# Patient Record
Sex: Female | Born: 1960 | Race: White | Hispanic: No | Smoking: Current every day smoker
Health system: Southern US, Community
[De-identification: ages and names within clinical notes are randomized; demographics above are authoritative.]

## PROBLEM LIST (undated history)

## (undated) DIAGNOSIS — G4733 Obstructive sleep apnea (adult) (pediatric): Secondary | ICD-10-CM

## (undated) DIAGNOSIS — K219 Gastro-esophageal reflux disease without esophagitis: Secondary | ICD-10-CM

## (undated) HISTORY — PX: APPENDECTOMY: SHX54

## (undated) HISTORY — DX: Gastro-esophageal reflux disease without esophagitis: K21.9

## (undated) HISTORY — PX: INCONTINENCE SURGERY: SHX676

## (undated) HISTORY — PX: ABDOMINAL HYSTERECTOMY: SHX81

## (undated) HISTORY — PX: TONSILLECTOMY: SUR1361

---

## 2000-10-15 ENCOUNTER — Encounter: Payer: Self-pay | Admitting: *Deleted

## 2000-10-15 ENCOUNTER — Ambulatory Visit (HOSPITAL_COMMUNITY): Admission: RE | Admit: 2000-10-15 | Discharge: 2000-10-15 | Payer: Self-pay | Admitting: *Deleted

## 2002-02-07 ENCOUNTER — Encounter: Payer: Self-pay | Admitting: *Deleted

## 2002-02-07 ENCOUNTER — Emergency Department (HOSPITAL_COMMUNITY): Admission: EM | Admit: 2002-02-07 | Discharge: 2002-02-07 | Payer: Self-pay | Admitting: *Deleted

## 2002-03-09 ENCOUNTER — Encounter: Payer: Self-pay | Admitting: Emergency Medicine

## 2002-03-09 ENCOUNTER — Emergency Department (HOSPITAL_COMMUNITY): Admission: EM | Admit: 2002-03-09 | Discharge: 2002-03-09 | Payer: Self-pay | Admitting: Emergency Medicine

## 2008-04-27 ENCOUNTER — Encounter (INDEPENDENT_AMBULATORY_CARE_PROVIDER_SITE_OTHER): Payer: Self-pay | Admitting: General Surgery

## 2008-04-28 ENCOUNTER — Ambulatory Visit: Payer: Self-pay | Admitting: Gastroenterology

## 2008-04-28 ENCOUNTER — Inpatient Hospital Stay (HOSPITAL_COMMUNITY): Admission: AD | Admit: 2008-04-28 | Discharge: 2008-04-30 | Payer: Self-pay | Admitting: Emergency Medicine

## 2008-05-04 ENCOUNTER — Encounter (INDEPENDENT_AMBULATORY_CARE_PROVIDER_SITE_OTHER): Payer: Self-pay | Admitting: *Deleted

## 2008-06-14 ENCOUNTER — Encounter: Payer: Self-pay | Admitting: Gastroenterology

## 2009-07-22 ENCOUNTER — Emergency Department (HOSPITAL_COMMUNITY): Admission: EM | Admit: 2009-07-22 | Discharge: 2009-07-22 | Payer: Self-pay | Admitting: Emergency Medicine

## 2010-02-13 NOTE — Consult Note (Signed)
Summary: Consultation Report  Consultation Report   Imported By: Diana Eves 06/14/2008 10:34:36  _____________________________________________________________________  External Attachment:    Type:   Image     Comment:   External Document

## 2010-02-13 NOTE — Letter (Signed)
Summary: Scheduled Appointment  [facility]  [address]   [place], [place] [address]   Phone: [phone]  Fax: [phone]    May 04, 2008   Dear: [name]            DOB: 01-20-1960    I have been instructed to schedule you an appointment in our office.  Your appointment is as follows:   Date:  06/23/2008   Time:  9:00     Please be here 15 minutes early.   Provider:  Dr. Cira Servant    Please contact the office if you need to reschedule this appointment for a more convenient time.   Thank you,    Elinor Parkinson       [facility] Ph: [phone]   Fax: [phone]

## 2010-04-25 LAB — DIFFERENTIAL
Basophils Absolute: 0 10*3/uL (ref 0.0–0.1)
Basophils Absolute: 0.1 10*3/uL (ref 0.0–0.1)
Basophils Relative: 0 % (ref 0–1)
Basophils Relative: 0 % (ref 0–1)
Eosinophils Absolute: 0 10*3/uL (ref 0.0–0.7)
Eosinophils Absolute: 0.1 10*3/uL (ref 0.0–0.7)
Eosinophils Relative: 0 % (ref 0–5)
Eosinophils Relative: 1 % (ref 0–5)
Lymphocytes Relative: 11 % — ABNORMAL LOW (ref 12–46)
Lymphocytes Relative: 4 % — ABNORMAL LOW (ref 12–46)
Lymphs Abs: 0.8 10*3/uL (ref 0.7–4.0)
Lymphs Abs: 1.6 10*3/uL (ref 0.7–4.0)
Monocytes Absolute: 0.7 10*3/uL (ref 0.1–1.0)
Monocytes Absolute: 0.7 10*3/uL (ref 0.1–1.0)
Monocytes Relative: 4 % (ref 3–12)
Monocytes Relative: 5 % (ref 3–12)
Neutro Abs: 12 10*3/uL — ABNORMAL HIGH (ref 1.7–7.7)
Neutro Abs: 16.5 10*3/uL — ABNORMAL HIGH (ref 1.7–7.7)
Neutrophils Relative %: 83 % — ABNORMAL HIGH (ref 43–77)
Neutrophils Relative %: 91 % — ABNORMAL HIGH (ref 43–77)

## 2010-04-25 LAB — URINALYSIS, ROUTINE W REFLEX MICROSCOPIC
Bilirubin Urine: NEGATIVE
Glucose, UA: NEGATIVE mg/dL
Hgb urine dipstick: NEGATIVE
Ketones, ur: NEGATIVE mg/dL
Leukocytes, UA: NEGATIVE
Nitrite: POSITIVE — AB
Protein, ur: NEGATIVE mg/dL
Specific Gravity, Urine: <1.005 — ABNORMAL LOW (ref 1.005–1.030)
Urobilinogen, UA: 0.2 mg/dL (ref 0.0–1.0)
pH: 6 (ref 5.0–8.0)

## 2010-04-25 LAB — CBC
HCT: 38.2 % (ref 36.0–46.0)
HCT: 41.9 % (ref 36.0–46.0)
Hemoglobin: 13.2 g/dL (ref 12.0–15.0)
Hemoglobin: 14.8 g/dL (ref 12.0–15.0)
MCHC: 34.6 g/dL (ref 30.0–36.0)
MCHC: 35.4 g/dL (ref 30.0–36.0)
MCV: 97.1 fL (ref 78.0–100.0)
MCV: 98.8 fL (ref 78.0–100.0)
Platelets: 310 10*3/uL (ref 150–400)
Platelets: 317 10*3/uL (ref 150–400)
RBC: 3.87 MIL/uL (ref 3.87–5.11)
RBC: 4.32 MIL/uL (ref 3.87–5.11)
RDW: 14.3 % (ref 11.5–15.5)
RDW: 14.8 % (ref 11.5–15.5)
WBC: 14.4 10*3/uL — ABNORMAL HIGH (ref 4.0–10.5)
WBC: 18 10*3/uL — ABNORMAL HIGH (ref 4.0–10.5)

## 2010-04-25 LAB — HEPATIC FUNCTION PANEL
ALT: 28 U/L (ref 0–35)
AST: 29 U/L (ref 0–37)
Albumin: 3.3 g/dL — ABNORMAL LOW (ref 3.5–5.2)
Alkaline Phosphatase: 83 U/L (ref 39–117)
Bilirubin, Direct: 0.1 mg/dL (ref 0.0–0.3)
Indirect Bilirubin: 0.5 mg/dL (ref 0.3–0.9)
Total Bilirubin: 0.6 mg/dL (ref 0.3–1.2)
Total Protein: 6.4 g/dL (ref 6.0–8.3)

## 2010-04-25 LAB — URINE MICROSCOPIC-ADD ON

## 2010-04-25 LAB — BASIC METABOLIC PANEL
BUN: 7 mg/dL (ref 6–23)
CO2: 25 mEq/L (ref 19–32)
Calcium: 8.3 mg/dL — ABNORMAL LOW (ref 8.4–10.5)
Chloride: 107 mEq/L (ref 96–112)
Creatinine, Ser: 0.81 mg/dL (ref 0.4–1.2)
GFR calc Af Amer: >60 mL/min (ref >60)
GFR calc non Af Amer: >60 mL/min (ref >60)
Glucose, Bld: 118 mg/dL — ABNORMAL HIGH (ref 70–99)
Potassium: 3.8 mEq/L (ref 3.5–5.1)
Sodium: 136 mEq/L (ref 135–145)

## 2010-04-25 LAB — COMPREHENSIVE METABOLIC PANEL
ALT: 38 U/L — ABNORMAL HIGH (ref 0–35)
AST: 41 U/L — ABNORMAL HIGH (ref 0–37)
Albumin: 3.9 g/dL (ref 3.5–5.2)
Alkaline Phosphatase: 104 U/L (ref 39–117)
BUN: 12 mg/dL (ref 6–23)
CO2: 20 mEq/L (ref 19–32)
Calcium: 9.2 mg/dL (ref 8.4–10.5)
Chloride: 103 mEq/L (ref 96–112)
Creatinine, Ser: 0.71 mg/dL (ref 0.4–1.2)
GFR calc Af Amer: >60 mL/min (ref >60)
GFR calc non Af Amer: >60 mL/min (ref >60)
Glucose, Bld: 139 mg/dL — ABNORMAL HIGH (ref 70–99)
Potassium: 3.7 mEq/L (ref 3.5–5.1)
Sodium: 135 mEq/L (ref 135–145)
Total Bilirubin: 0.5 mg/dL (ref 0.3–1.2)
Total Protein: 7.4 g/dL (ref 6.0–8.3)

## 2010-04-25 LAB — PROTIME-INR
INR: 1.1 (ref 0.00–1.49)
Prothrombin Time: 14 seconds (ref 11.6–15.2)

## 2010-04-25 LAB — HEPATITIS C ANTIBODY: HCV Ab: REACTIVE — AB

## 2010-04-25 LAB — LIPASE, BLOOD: Lipase: 16 U/L (ref 11–59)

## 2010-05-29 NOTE — Op Note (Signed)
NAMEJOSSELYN, [name]                ACCOUNT NO.:  [id number]   MEDICAL RECORD NO.:  [id number]          PATIENT TYPE:  INP   LOCATION:  A340                          FACILITY:  [facility]   PHYSICIAN:  Barbaraann Barthel, M.D. DATE OF BIRTH:  November 02, 1960   DATE OF PROCEDURE:  04/28/2008  DATE OF DISCHARGE:                               OPERATIVE REPORT   SURGEON:  Barbaraann Barthel, MD   PREOPERATIVE DIAGNOSIS:  Acute appendicitis.   POSTOPERATIVE DIAGNOSIS:  Acute appendicitis. unknown lesion at base of  tongue   PROCEDURE:  Open appendectomy.   NOTE:  This is a 50 year old white female, who came to the emergency  room with about a 12- to 14-hour history of right lower quadrant pain,  nausea and vomiting, had a CT scan that was positive for appendicitis.  We scheduled her for surgery after discussing in detail with  complications not limited to, but including bleeding, infection, and  appendiceal stump leak.  Informed consent was obtained.   GROSS OPERATIVE FINDINGS:  Those consistent with acute supportive non-  perforated appendicitis.  There was considerable scarring from her  previous total abdominal hysterectomy; otherwise, terminal ileum  appeared to be normal and there were no other findings except the  adhesions in the right lower quadrant.   SPECIMEN:  Appendix.   WOUND CLASSIFICATION:  Infected.   TECHNIQUE:  The patient was placed in a supine position and after the  adequate administration of general anesthesia via endotracheal  intubation, her entire abdomen was prepped with Betadine solution and  draped in usual manner.  Prior to this, Foley catheter was aseptically  inserted.  A right lower quadrant transverse incision was carried out  through the skin and subcutaneous tissue down to the rectus muscle,  which was retracted medially.  The posterior sheath and peritoneum were  grasped and opened.  The abdomen was explored with the above findings.  The patient had a very  thickened appendix.  This was dissected free from  the fatty adhesions.  The mesoappendix was ligated with 2-0 silk and  amputated with TA-30 stapler device.  We then changed gloves, irrigated  the right lower quadrant and I elected to leave a drain in the area of  the cecum due to the number of adhesions in this area.  We copiously  irrigated with normal saline solution and I closed the peritoneum with a  running 0 Polysorb suture and the fascia with interrupted figure-of-  eight Polysorb sutures.  I irrigated the subcu and closed with stapling  device.  Drain was sutured and placed with 4-0 nylon.  Prior to closure,  all sponge, needle, and instrument counts were found to be correct.  Estimated blood loss was minimal.  The patient received 900 mL of  crystalloids intraoperatively.  There were no complications.   NOTE: At intubation she was noted to have a lesion at the base of her  tongue.  We will followup with an ENT consult post operatively.      Barbaraann Barthel, M.D.  Electronically Signed     WB/MEDQ  D:  04/28/2008  T:  04/28/2008  Job:  [id number]   cc:   Dr. Adriana Simas

## 2010-05-29 NOTE — Consult Note (Signed)
[name], [name]                ACCOUNT NO.:  [id number]   MEDICAL RECORD NO.:  [id number]          PATIENT TYPE:  INP   LOCATION:  A340                          FACILITY:  [facility]   PHYSICIAN:  Kassie Mends, M.D.      DATE OF BIRTH:  05-12-1960   DATE OF CONSULTATION:  04/28/2008  DATE OF DISCHARGE:                                 CONSULTATION   REASON FOR CONSULTATION:  History of hepatitis C.   REQUESTING PHYSICIAN:  Barbaraann Barthel, M.D.   HISTORY OF PRESENT ILLNESS:  The patient is a 50 year old Caucasian  female who was admitted to the hospital with acute appendicitis.  She  underwent appendectomy earlier today.  She gives a history of hepatitis  C diagnosed 5 years ago.  She was followed by Dr. Allena Katz in [place],  [place], at the time.  She states that she was offered antiviral  treatment, but she was unable to afford the therapy.  She never followed  up with Dr. Allena Katz.  She said she had a liver biopsy at the time, was  told she had fibrosis but cirrhosis.  CT of the abdomen and pelvis done  yesterday showed diffuse fatty liver, no evidence of cirrhosis.  She  also had acute appendicitis as mentioned above.  The patient states on a  chronic basis she really is asymptomatic.  She does have some  intermittent constipation for which she takes lactulose.  Denies any  melena or rectal bleeding, abdominal pain, nausea, vomiting, or  heartburn on a regular basis.  Her current symptoms began yesterday  morning and progressed throughout the day prior to her evaluation here  at [facility].  She is actually referred here by her primary  care physician yesterday.   The patient states that she believes her risk factor for hepatitis C was  her IV drug use back in her 76s.  She denies any ongoing drug use.  She  rarely consumes alcohol, only on occasion.  She had a glass of wine for  her birthday about four weeks ago.   MEDICATIONS AT HOME:  Premarin and lactulose.   ALLERGIES:  DOXYCYCLINE causes hives.   PAST MEDICAL HISTORY:  1. History of hepatitis C as outlined above.  2. IBS.  3. Tonsillectomy.  4. Total abdominal hysterectomy with bilateral salpingo-oophorectomy      for endometriosis in 2004.  5. Appendectomy yesterday.   FAMILY HISTORY:  According to EMR from [facility],  father has history of hepatitis C and pancreatitis.  Grandmother had  liver cancer.  Sister has lupus.  The patient tells me that there is a  family member in the home with hepatitis C, but she would not elaborate.  She did not confirm that it was her father.   SOCIAL HISTORY:  Married.  One child.  Smokes a pack of cigarettes a  day.  Rarely consumes alcohol.  History of prior IV drug use in her 52s.   REVIEW OF SYSTEMS:  See HPI for GI.  CONSTITUTIONAL:  No weight loss.  CARDIOPULMONARY:  No chest pain or  shortness of breath, palpitations or  cough.  GENITOURINARY:  No dysuria or hematuria.   PHYSICAL EXAMINATION:  Temperature 99, pulse 84, respirations 16, blood  pressure 113/71, weight 89.6 kg.  GENERAL:  Pleasant, well-developed, well-nourished, Caucasian female in  no acute distress.  SKIN:  Warm, dry, no jaundice.  HEENT:  Sclerae nonicteric.  Oropharyngeal mucosa moist and pink.  CHEST:  Lungs clear to auscultation.  CARDIAC EXAM:  Reveals regular rate and rhythm.  No murmurs, rubs, or  gallops.  ABDOMEN:  Positive bowel sounds.  Abdomen is obese, soft.  Incisions are  dressed.  She has a JP with serosanguineous fluid.  Did not appreciate  organomegaly or masses but exam limited somewhat due to patient's  discomfort.  No abdominal hernias or bruits.  LOWER EXTREMITIES:  No edema.   LABORATORY DATA:  White count 18,000 on admission; down to 14,400.  Hemoglobin is 13.2, platelets 310,000.  Albumin 3.3, total bilirubin  0.6, alkaline phosphatase 83, AST 41 on admission - down to 29, ALT 38  on admission - down to 28.  Sodium 136, potassium  3.8, BUN 7, creatinine  0.81, glucose 118.  Lipase 16.  CT of the abdomen and pelvis was  outlined above.   IMPRESSION:  The patient is a very pleasant 50 year old Caucasian female  who was admitted with acute appendicitis who is status post appendectomy  earlier today.  She gives a history of chronic hepatitis C diagnosed in  2005 with fibrosis on liver biopsy.  She has never been treated due to  financial difficulties according to the patient.  She appears to be an  adequate candidate for antiviral therapy.  No history of major  depression.  Very limited alcohol use.  She believes that she could  eliminate use completely.  We could likely find her some assistance at  the hepatitis C clinic in [place] for treatment.   She was noted to have a lesion on the base of her tongue at time of  intubation per Dr. Barbaraann Barthel.  This needs to be evaluated prior  to any consideration of antiviral therapy, however.   PLAN:  1. Will check a hepatitis C virus antibody.  2. Check INR.  3. She will follow up with Dr. Cira Servant in 2 months regarding her      hepatitis C and to facilitate referral to the hepatitis clinic as      necessary.  Would also advise colonoscopy at age 66 if she has not      had one previously.  4. Alcohol cessation.  5. Moderate weight loss, no more than 1 pound a week for a goal of 25-      pound weight loss.   I would like to thank Dr. Barbaraann Barthel for allowing [place] to take part  in the care of this patient.      Tana Coast, P.A.      Kassie Mends, M.D.  Electronically Signed    LL/MEDQ  D:  04/28/2008  T:  04/28/2008  Job:  [id number]   cc:   [facility]

## 2010-05-29 NOTE — Discharge Summary (Signed)
[name], [name]                ACCOUNT NO.:  [id number]   MEDICAL RECORD NO.:  [id number]          PATIENT TYPE:  INP   LOCATION:  A340                          FACILITY:  [facility]   PHYSICIAN:  Barbaraann Barthel, M.D. DATE OF BIRTH:  07/29/1960   DATE OF ADMISSION:  04/27/2008  DATE OF DISCHARGE:  04/17/2010LH                               DISCHARGE SUMMARY   DIAGNOSIS:  Acute appendicitis.   SECONDARY DIAGNOSES:  1. Hepatitis C.  2. Growth at the base of tongue.  3. History of IV drug abuse and continued occasional alcohol use.   PROCEDURE:  On April 27, 2008 and April 28, 2008, open appendectomy.   NOTE:  This is a 50 year old white female who came into the emergency  room with signs and symptoms of acute appendicitis.  This was confirmed  on CT scan as she was operated in the wee hour hours of the night and  was found to have acute suppurative  nonperforated appendicitis.   (Final pathology is pending at the time of this dictation).   The patient while during intubation was noted to have a growth at the  base of her tongue.  We do not have ENT available at [facility]  and I made her aware of the need for followup with Dr. Raye Sorrow group  through the [facility] and his office number was given to her on  the discharge information.   She has also not been seen for her hepatitis C for many years, the only  drugs that she takes is lactulose, she was seen here by the GI Service  and arrangements were made for followup for that.  Her liver function  studies, it that should be stated were within normal limits as was her  INR.  It was disturbing that she still uses occasional alcohol and she  was advised very strongly to desist from this.   Regarding her appendicitis, her hospital course was uneventful.  She had  a lot of adhesions from her previous hysterectomy and I elected to leave  a drain in place.  This was removed on the third postoperative day at  which  time she was passing gas and tolerating p.o. well and her wound  was clean without any signs of infection, and she had no shortness of  breath or leg pain or other untoward symptoms.  She remained afebrile  postoperatively with a downward trend of her white count.   DISCHARGE INSTRUCTIONS:  As mentioned.  She has been giving the  telephone number of Dr. Cira Servant and Dr. Lazarus Salines to follow up on her  hepatitis C and a growth at the base of her tongue respectively.  We  have made arrangements to follow up with her in a week's time.  She  continued to p.o. Cipro 500 mg p.o. b.i.d. her next dose to be given  this evening on April 30, 2008 at 10 o'clock p.m.   LABORATORY DATA:  As mentioned.  She had elevated white count, on  admission 18,000.  She had a CT scan which revealed acute appendicitis.  Postoperatively, her white count went down to 14,000 and she remained  afebrile and as stated her liver function studies were within normal  limits and she is going to be worked up further for regarding her  hepatitis C with the GI service as an outpatient.   DISCHARGE MEDICATIONS:  She is discharged on:  1. Darvocet-N 100 one tablet every 4-6 hours as needed for pain.  2. Colace 100 mg daily for bowels as needed.  3. Cipro 500 mg every 12 hours with the next dose being at 10 o'clock      this evening as stated.  4. She was told to refrain from any aspirin products and totally      refrain from alcohol.  5. She was told she can continue her Premarin 1.25 mg daily.  6. Lactulose 30 mL 3 times a day as needed for her bowels.   We have made followup arrangements to see her in the office on Thursday  at 10 a.m. on May 05, 2008.  She was told to contact us or go to the  emergency room should there be any acute changes.  She was told to clean  her wound with alcohol 3 times a day and after each shower.  She was  told to refrain from going swimming in a swimming pool or in a tub bath  or in a Jacuzzi  and we will follow up with her perioperatively.  She was  discharged on a full liquid.  She has also excused from her work.  She  has been given a note for her [employer] which she needs to  perform, and she was excused from this for 3 week's time.      Barbaraann Barthel, M.D.  Electronically Signed     WB/MEDQ  D:  04/30/2008  T:  04/30/2008  Job:  [id number]   cc:   Kassie Mends, M.D.  [address]  [place] , [place] [address]   Gloris Manchester. Lazarus Salines, M.D.  Fax: [phone]

## 2010-05-29 NOTE — Consult Note (Signed)
NAMEGLADINE, [name]                ACCOUNT NO.:  [id number]   MEDICAL RECORD NO.:  [id number]          PATIENT TYPE:  INP   LOCATION:  A340                          FACILITY:  [facility]   PHYSICIAN:  Barbaraann Barthel, M.D. DATE OF BIRTH:  1960/03/04   DATE OF CONSULTATION:  04/27/2008  DATE OF DISCHARGE:                                 CONSULTATION   Surgery was asked to see this 50 year old white female who came to the  emergency room with about a 12- to 14-hour history of right lower  quadrant pain accompanied with nausea and vomiting.  She states that she  was in her usual state of good health yesterday and developed this pain  this morning and this pain localized later into the right lower  quadrant.  She sought medical attention at the [facility] and  then was referred to the emergency room where she was worked up and was  found on CT scan to have acute appendicitis.  Surgery was consulted and  responded immediately.   PAST SURGERIES:  TAH-BSO done in 2004 and she had tonsillectomy done in  childhood.  She also had a liver biopsy for hepatitis C, this came about  through IV drug use.  She was diagnosed with hepatitis C in 2005.  She  did not receive treatment with interferon her other antiviral drugs.   MEDICATIONS:  She takes Premarin and lactulose.   She is allergic to DOXYCYCLINE.   She does smoke a pack of cigarettes per day and despite her diagnosis of  hepatitis occasionally imbibes alcohol, the last time she drank some  alcohol was this weekend and she had a little wine.   PHYSICAL EXAMINATION:  VITAL SIGNS:  The patient has a temperature of  97.6, pulse rate of 88, respirations 18 per minute with an O2 sat 95%.  Her blood pressure was approximately 140/78.  She is 5 feet 8 inches and  weighs 194 pounds.  HEENT:  Head is normocephalic.  Eyes:  Extraocular movements are intact.  Pupils are round and react to light and accommodation.  The pupils seem  somewhat  more pinpoint, however, she states she has not done any recent  drugs.  There is no icteric tincture.  Nose and oral mucosa are somewhat  dry.  The patient has an upper dental prosthesis, which is removed.  NECK:  Supple and cylindrical without jugular vein distention,  thyromegaly, or tracheal deviation.  There is no cervical adenopathy and  no bruits are auscultated.  CHEST:  Clear, both anterior and posterior auscultation.  HEART:  Regular rhythm.  BREASTS AND AXILLA:  Without masses.  ABDOMEN:  The patient has diminished bowel sounds.  She is tender in the  right lower quadrant with some guarding.  PELVIC:  Deferred.  RECTAL:  There is guaiac-negative stool.  EXTREMITIES:  No varicosities, cyanosis, or pedal edema.  The patient  does have some pain with her right foot occasionally and she has in the  past had a dislocated left knee.  SKIN:  Integuments are grossly within normal limits without icterus.  NEUROLOGIC:  Grossly within normal limits.   The patient approximately 5 years ago had some problems with ammonia  buildup.  Her liver function studies, however, here in the emergency  room have been all completely within normal limits.  Her AST is 41.  Her  ALT is 38.  Her alk phosphatase is 104.  Bilirubin is 0.5.  Her  electrolytes are also within normal limits.  Her white count is 18,000  with an H&H of 14.8 and 41.9 with 91% neutrophilia.   REVIEW OF SYSTEMS:  GI SYSTEM:  Nausea and vomiting since this morning  with approximately 12- to 14-hour history of abdominal pain localizing  to the right lower quadrant.  She has had in the past history of  irritable bowel syndrome and as stated above she has a history of  hepatitis C that has been caused by IV drug use.  GU SYSTEM:  No history  of nephrolithiasis.  No history of dysuria.  Urinalysis is pending.  CARDIORESPIRATORY SYSTEM:  The patient smokes a pack of cigarettes per  day.  No history of chest pain or any cardiac problems  otherwise.  ENDOCRINE SYSTEM:  No history of diabetes or thyroid disease.  MUSCULOSKELETAL SYSTEM:  Grossly within normal limits.  The patient is  somewhat overweight.   OB/GYN HISTORY:  She is a gravid 3, para 1, abortus 2, cesarean 0 female  with a history of a TAH-BSO done in 2004 and she has a maternal aunt  that had a history of breast carcinoma.  She herself has not had a  mammogram for 5 years.  She has also had a history of endometriosis that  was treated by her hysterectomy.   IMPRESSION:  Therefore, [name] is a 50 year old white female with  signs and symptoms of acute appendicitis.  She also has a history of  hepatitis C and she also abuses tobacco.  She also has a history of IV  drug use.   PLAN:  The patient is n.p.o., she has received Cipro, she has been  hydrated, and we will take her to the operating room as soon as  possible.  We discussed the surgery in detail with the patient  discussing complications not limited to, but including bleeding,  infection, and appendiceal stump leak.  Informed consent was obtained.      Barbaraann Barthel, M.D.  Electronically Signed     WB/MEDQ  D:  04/27/2008  T:  04/28/2008  Job:  [id number]   cc:   Donnetta Hutching, MD  [address] [place], [place] [address]

## 2013-12-14 ENCOUNTER — Ambulatory Visit (HOSPITAL_COMMUNITY): Payer: Self-pay | Admitting: Physical Therapy

## 2015-04-05 ENCOUNTER — Emergency Department (HOSPITAL_COMMUNITY)
Admission: EM | Admit: 2015-04-05 | Discharge: 2015-04-05 | Disposition: A | Discharge status: Home / Self Care | Payer: Medicaid Other | Attending: Emergency Medicine | Admitting: Emergency Medicine

## 2015-04-05 ENCOUNTER — Emergency Department (HOSPITAL_COMMUNITY): Payer: Medicaid Other

## 2015-04-05 ENCOUNTER — Encounter (HOSPITAL_COMMUNITY): Payer: Self-pay | Admitting: Emergency Medicine

## 2015-04-05 DIAGNOSIS — L089 Local infection of the skin and subcutaneous tissue, unspecified: Secondary | ICD-10-CM | POA: Diagnosis not present

## 2015-04-05 DIAGNOSIS — Z79899 Other long term (current) drug therapy: Secondary | ICD-10-CM | POA: Insufficient documentation

## 2015-04-05 DIAGNOSIS — M545 Low back pain, unspecified: Secondary | ICD-10-CM

## 2015-04-05 DIAGNOSIS — R05 Cough: Secondary | ICD-10-CM | POA: Diagnosis present

## 2015-04-05 DIAGNOSIS — J40 Bronchitis, not specified as acute or chronic: Secondary | ICD-10-CM

## 2015-04-05 DIAGNOSIS — F17210 Nicotine dependence, cigarettes, uncomplicated: Secondary | ICD-10-CM | POA: Diagnosis not present

## 2015-04-05 LAB — URINALYSIS, ROUTINE W REFLEX MICROSCOPIC
Bilirubin Urine: NEGATIVE
Glucose, UA: NEGATIVE mg/dL
Ketones, ur: NEGATIVE mg/dL
Leukocytes, UA: NEGATIVE
Nitrite: POSITIVE — AB
Protein, ur: 30 mg/dL — AB
Specific Gravity, Urine: 1.015 (ref 1.005–1.030)
pH: 6.5 (ref 5.0–8.0)

## 2015-04-05 LAB — URINE MICROSCOPIC-ADD ON

## 2015-04-05 MED ORDER — SULFAMETHOXAZOLE-TRIMETHOPRIM 800-160 MG PO TABS: 1.0000 | ORAL_TABLET | Freq: Two times a day (BID) | ORAL | Status: AC

## 2015-04-05 MED ORDER — ALBUTEROL SULFATE HFA 108 (90 BASE) MCG/ACT IN AERS: 1.0000 | INHALATION_SPRAY | Freq: Four times a day (QID) | RESPIRATORY_TRACT | Status: AC | PRN

## 2015-04-05 MED ORDER — IBUPROFEN 800 MG PO TABS: 800.0000 mg | ORAL_TABLET | Freq: Three times a day (TID) | ORAL | Status: DC

## 2015-04-05 NOTE — Discharge Instructions (Signed)
Back Pain, Adult °Back pain is very common in adults. The cause of back pain is rarely dangerous and the pain often gets better over time. The cause of your back pain may not be known. Some common causes of back pain include: °· Strain of the muscles or ligaments supporting the spine. °· Wear and tear (degeneration) of the spinal disks. °· Arthritis. °· Direct injury to the back. °For many people, back pain may return. Since back pain is rarely dangerous, most people can learn to manage this condition on their own. °HOME CARE INSTRUCTIONS °Watch your back pain for any changes. The following actions may help to lessen any discomfort you are feeling: °· Remain active. It is stressful on your back to sit or stand in one place for long periods of time. Do not sit, drive, or stand in one place for more than 30 minutes at a time. Take short walks on even surfaces as soon as you are able. Try to increase the length of time you walk each day. °· Exercise regularly as directed by your health care provider. Exercise helps your back heal faster. It also helps avoid future injury by keeping your muscles strong and flexible. °· Do not stay in bed. Resting more than 1-2 days can delay your recovery. °· Pay attention to your body when you bend and lift. The most comfortable positions are those that put less stress on your recovering back. Always use proper lifting techniques, including: °· Bending your knees. °· Keeping the load close to your body. °· Avoiding twisting. °· Find a comfortable position to sleep. Use a firm mattress and lie on your side with your knees slightly bent. If you lie on your back, put a pillow under your knees. °· Avoid feeling anxious or stressed. Stress increases muscle tension and can worsen back pain. It is important to recognize when you are anxious or stressed and learn ways to manage it, such as with exercise. °· Take medicines only as directed by your health care provider. Over-the-counter  medicines to reduce pain and inflammation are often the most helpful. Your health care provider may prescribe muscle relaxant drugs. These medicines help dull your pain so you can more quickly return to your normal activities and healthy exercise. °· Apply ice to the injured area: °· Put ice in a plastic bag. °· Place a towel between your skin and the bag. °· Leave the ice on for 20 minutes, 2-3 times a day for the first 2-3 days. After that, ice and heat may be alternated to reduce pain and spasms. °· Maintain a healthy weight. Excess weight puts extra stress on your back and makes it difficult to maintain good posture. °SEEK MEDICAL CARE IF: °· You have pain that is not relieved with rest or medicine. °· You have increasing pain going down into the legs or buttocks. °· You have pain that does not improve in one week. °· You have night pain. °· You lose weight. °· You have a fever or chills. °SEEK IMMEDIATE MEDICAL CARE IF:  °· You develop new bowel or bladder control problems. °· You have unusual weakness or numbness in your arms or legs. °· You develop nausea or vomiting. °· You develop abdominal pain. °· You feel faint. °  °This information is not intended to replace advice given to you by your health care provider. Make sure you discuss any questions you have with your health care provider. °  °Document Released: 12/31/2004 Document Revised: 01/21/2014 Document Reviewed: 05/04/2013 °Elsevier Interactive Patient Education ©2016 [employer] Wound Infection A wound infection happens when a type of germ (bacteria) starts growing in the wound. In some cases, this can cause the wound to break open. If cared for properly, the infected wound will heal from the inside to the outside. Wound infections need treatment. CAUSES An infection is caused by bacteria growing in the wound.  SYMPTOMS   Increase in redness, swelling, or pain at the wound site.  Increase in drainage at the wound site.  Wound or bandage  (dressing) starts to smell bad.  Fever.  Feeling tired or fatigued.  Pus draining from the wound. TREATMENT  Your health care provider will prescribe antibiotic medicine. The wound infection should improve within 24 to 48 hours. Any redness around the wound should stop spreading and the wound should be less painful.  HOME CARE INSTRUCTIONS   Only take over-the-counter or prescription medicines for pain, discomfort, or fever as directed by your health care provider.  Take your antibiotics as directed. Finish them even if you start to feel better.  Gently wash the area with mild soap and water 2 times a day, or as directed. Rinse off the soap. Pat the area dry with a clean towel. Do not rub the wound. This may cause bleeding.  Follow your health care provider's instructions for how often you need to change the dressing.  Apply ointment and a dressing to the wound as directed.  If the dressing sticks, moisten it with soapy water and gently remove it.  Change the bandage right away if it becomes wet, dirty, or develops a bad smell.  Take showers. Do not take tub baths, swim, or do anything that may soak the wound until it is healed.  Avoid exercises that make you sweat heavily.  Use anti-itch medicine as directed by your health care provider. The wound may itch when it is healing. Do not pick or scratch at the wound.  Follow up with your health care provider to get your wound rechecked as directed. SEEK MEDICAL CARE IF:  You have an increase in swelling, pain, or redness around the wound.  You have an increase in the amount of pus coming from the wound.  There is a bad smell coming from the wound.  More of the wound breaks open.  You have a fever. MAKE SURE YOU:   Understand these instructions.  Will watch your condition.  Will get help right away if you are not doing well or get worse.   This information is not intended to replace advice given to you by your health  care provider. Make sure you discuss any questions you have with your health care provider.   Document Released: 09/29/2002 Document Revised: 01/05/2013 Document Reviewed: 06/20/2014 Elsevier Interactive Patient Education 2016 [employer] Chronic Bronchitis Chronic bronchitis is a lasting inflammation of the bronchial tubes, which are the tubes that carry air into your lungs. This is inflammation that occurs:   On most days of the week.   For at least three months at a time.   Over a period of two years in a row. When the bronchial tubes are inflamed, they start to produce mucus. The inflammation and buildup of mucus make it more difficult to breathe. Chronic bronchitis is usually a permanent problem and is one type of chronic obstructive pulmonary disease (COPD). People with chronic bronchitis are at greater risk for getting repeated colds, or respiratory infections. CAUSES  Chronic bronchitis most often occurs in people who have:  Long-standing, severe  asthma.  A history of smoking.  Asthma and who also smoke. SIGNS AND SYMPTOMS  Chronic bronchitis may cause the following:   A cough that brings up mucus (productive cough).  Shortness of breath.  Early morning headache.  Wheezing.  Chest discomfort.   Recurring respiratory infections. DIAGNOSIS  Your health care provider may confirm the diagnosis by:  Taking your medical history.  Performing a physical exam.  Taking a chest X-ray.   Performing pulmonary function tests. TREATMENT  Treatment involves controlling symptoms with medicines, oxygen therapy, or making lifestyle changes, such as exercising and eating a healthy, well-balanced diet. Medicines could include:  Inhalers to improve air flow in and out of your lungs.  Antibiotics to treat bacterial infections, such as pneumonia, sinus infections, and acute bronchitis. As a preventative measure, your health care provider may recommend routine vaccinations  for influenza and pneumonia. This is to prevent infection and hospitalization since you may be more at risk for these types of infections.  HOME CARE INSTRUCTIONS  Take medicines only as directed by your health care provider.   If you smoke cigarettes, chew tobacco, or use electronic cigarettes, quit. If you need help quitting, ask your health care provider.  Avoid pollen, dust, animal dander, molds, smoke, and other things that cause shortness of breath or wheezing attacks.  Talk to your health care provider about possible exercise routines. Regular exercise is very important to help you feel better.  If you are prescribed oxygen use at home follow these guidelines:  Never smoke while using oxygen. Oxygen does not burn or explode, but flammable materials will burn faster in the presence of oxygen.  Keep a Government social research officer close by. Let your fire department know that you have oxygen in your home.  Warn visitors not to smoke near you when you are using oxygen. Put up "no smoking" signs in your home where you most often use the oxygen.  Regularly test your smoke detectors at home to make sure they work. If you receive care in your home from a nurse or other health care provider, he or she may also check to make sure your smoke detectors work.  Ask your health care provider whether you would benefit from a pulmonary rehabilitation program.  Do not wait to get medical care if you have any concerning symptoms. Delays could cause permanent injury and may be life threatening. SEEK MEDICAL CARE IF:  You have increased coughing or shortness of breath or both.  You have muscle aches.  You have chest pain.  Your mucus gets thicker.  Your mucus changes from clear or white to yellow, green, gray, or bloody. SEEK IMMEDIATE MEDICAL CARE IF:  Your usual medicines do not stop your wheezing.   You have increased difficulty breathing.   You have any problems with the medicine you are  taking, such as a rash, itching, swelling, or trouble breathing. MAKE SURE YOU:   Understand these instructions.  Will watch your condition.  Will get help right away if you are not doing well or get worse.   This information is not intended to replace advice given to you by your health care provider. Make sure you discuss any questions you have with your health care provider.   Document Released: 10/18/2005 Document Revised: 01/21/2014 Document Reviewed: 02/08/2013 Elsevier Interactive Patient Education [employer]

## 2015-04-05 NOTE — ED Notes (Signed)
Having lower back pain since last Thursday, rates pain 9/10.  having cold symptoms (chills, cough).

## 2015-04-05 NOTE — ED Notes (Signed)
Has hard area to left side of face and pt stuck with needle.  Pt not sure if may be an insect bite.  Rates pain 10/10.

## 2015-04-05 NOTE — ED Provider Notes (Signed)
CSN: [id number]     Arrival date & time 04/05/15  1046 History   First MD Initiated Contact with Patient 04/05/15 1123     Chief Complaint  Patient presents with  . Back Pain     (Consider location/radiation/quality/duration/timing/severity/associated sxs/prior Treatment) Patient is a 55 y.o. female presenting with back pain. The history is provided by the patient. No language interpreter was used.  Back Pain Location:  Generalized Quality:  Aching Radiates to:  Does not radiate Pain severity:  Moderate Pain is:  Same all the time Onset quality:  Gradual Timing:  Constant Progression:  Worsening Chronicity:  New Context: not recent injury   Relieved by:  Nothing Worsened by:  Nothing tried Ineffective treatments:  None tried Associated symptoms: no fever   Pt complains of low back pain.   Pt reports she has a cough  History reviewed. No pertinent past medical history. Past Surgical History  Procedure Laterality Date  . Abdominal hysterectomy    . Tonsillectomy    . Appendectomy     No family history on file. Social History  Substance Use Topics  . Smoking status: Current Every Day Smoker -- 0.50 packs/day    Types: Cigarettes  . Smokeless tobacco: None  . Alcohol Use: 0.6 oz/week    1 Cans of beer per week   OB History    No data available     Review of Systems  Constitutional: Negative for fever.  Musculoskeletal: Positive for back pain.  All other systems reviewed and are negative.     Allergies  Doxycycline  Home Medications   Prior to Admission medications   Medication Sig Start Date End Date Taking? Authorizing Provider  loratadine (CLARITIN) 10 MG tablet Take 10 mg by mouth daily.   Yes Historical Provider, MD  Omeprazole 20 MG TBEC Take 1 tablet by mouth daily.  08/12/08  Yes Historical Provider, MD  temazepam (RESTORIL) 15 MG capsule Take 15 mg by mouth at bedtime as needed for sleep.   Yes Historical Provider, MD   BP 138/82 mmHg  Pulse 96   Temp(Src) 98 F (36.7 C) (Oral)  Resp 18  Ht  (1.727 m)  Wt 81.647 kg  BMI 27.38 kg/m2  SpO2 100% Physical Exam  Constitutional: She appears well-developed and well-nourished.  HENT:  Head: Normocephalic.  Right Ear: External ear normal.  Left Ear: External ear normal.  Nose: Nose normal.  Mouth/Throat: Oropharynx is clear and moist.  Eyes: Conjunctivae are normal.  Neck: Normal range of motion. Neck supple.  Cardiovascular: Normal rate, normal heart sounds and intact distal pulses.   Pulmonary/Chest: Effort normal and breath sounds normal.  Musculoskeletal: Normal range of motion.  Neurological: She is alert.  Skin: Skin is warm.  Nursing note and vitals reviewed.   ED Course  Procedures (including critical care time) Labs Review Labs Reviewed  URINALYSIS, ROUTINE W REFLEX MICROSCOPIC (NOT AT [facility]) - Abnormal; Notable for the following:    Hgb urine dipstick TRACE (*)    Protein, ur 30 (*)    Nitrite POSITIVE (*)    All other components within normal limits  URINE MICROSCOPIC-ADD ON - Abnormal; Notable for the following:    Squamous Epithelial / LPF 6-30 (*)    Bacteria, UA FEW (*)    All other components within normal limits    Imaging Review Dg Chest 2 View  04/05/2015  CLINICAL DATA:  55 year old female with a history of body aches and productive cough. EXAM: CHEST -  2 VIEW COMPARISON:  No prior for comparison FINDINGS: Cardiomediastinal silhouette within normal limits. No confluent airspace disease, pneumothorax, or pleural effusion. Stigmata of emphysema, with increased retrosternal airspace, flattened hemidiaphragms, increased AP diameter, and hyperinflation on the AP view. 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 No displaced fracture. Unremarkable appearance of the upper abdomen. IMPRESSION: Background of emphysema without evidence of superimposed acute cardiopulmonary disease. 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 Signed, Yvone NeuJaime S. Loreta AveWagner, DO [facility] Electronically Signed   By: Gilmer MorJaime  Wagner D.O.   On: 04/05/2015 12:38   I have personally reviewed and evaluated these images and lab results as part of my medical decision-making.   EKG Interpretation None      MDM   Final diagnoses:  Bronchitis  Skin infection  Right-sided low back pain without sciatica    Meds ordered this encounter  Medications  . Omeprazole 20 MG TBEC    Sig: Take 1 tablet by mouth daily.   . temazepam (RESTORIL) 15 MG capsule    Sig: Take 15 mg by mouth at bedtime as needed for sleep.  Marland Kitchen. loratadine (CLARITIN) 10 MG tablet    Sig: Take 10 mg by mouth daily.  Marland Kitchen. sulfamethoxazole-trimethoprim (BACTRIM DS,SEPTRA DS) 800-160 MG tablet    Sig: Take 1 tablet by mouth 2 (two) times daily.    Dispense:  14 tablet    Refill:  0    Order Specific Question:  Supervising Provider    Answer:  MILLER, BRIAN [[id number]]  . ibuprofen (ADVIL,MOTRIN) 800 MG tablet    Sig: Take 1 tablet (800 mg total) by mouth 3 (three) times daily.    Dispense:  21 tablet    Refill:  0    Order Specific Question:  Supervising Provider    Answer:  MILLER, BRIAN [[id number]]  . albuterol (PROVENTIL HFA;VENTOLIN HFA) 108 (90 Base) MCG/ACT inhaler    Sig: Inhale 1-2 puffs into the lungs every 6 (six) hours as needed for wheezing or shortness of breath.    Dispense:  1 Inhaler    Refill:  0    Order Specific Question:  Supervising Provider    Answer:  Eber HongMILLER, BRIAN [[id number]]      Lonia SkinnerLeslie K Huntington CenterSofia, PA-C 04/05/15 1324  Vanetta MuldersScott Zackowski, MD 04/05/15 1553

## 2016-02-12 ENCOUNTER — Other Ambulatory Visit (HOSPITAL_COMMUNITY): Payer: Self-pay | Admitting: Emergency Medicine

## 2016-02-12 DIAGNOSIS — Z13820 Encounter for screening for osteoporosis: Secondary | ICD-10-CM

## 2016-02-21 ENCOUNTER — Other Ambulatory Visit (HOSPITAL_COMMUNITY): Payer: Self-pay

## 2016-03-01 ENCOUNTER — Other Ambulatory Visit (HOSPITAL_COMMUNITY): Payer: Self-pay

## 2016-03-06 ENCOUNTER — Encounter (HOSPITAL_COMMUNITY): Payer: Self-pay | Admitting: Cardiology

## 2016-03-06 ENCOUNTER — Emergency Department (HOSPITAL_COMMUNITY): Payer: Medicaid Other

## 2016-03-06 ENCOUNTER — Ambulatory Visit (HOSPITAL_COMMUNITY)
Admission: RE | Admit: 2016-03-06 | Discharge: 2016-03-06 | Disposition: A | Discharge status: Home / Self Care | Payer: Medicaid Other | Source: Ambulatory Visit | Attending: Emergency Medicine | Admitting: Emergency Medicine

## 2016-03-06 ENCOUNTER — Emergency Department (HOSPITAL_COMMUNITY)
Admission: EM | Admit: 2016-03-06 | Discharge: 2016-03-06 | Disposition: A | Discharge status: Home / Self Care | Payer: Medicaid Other | Attending: Emergency Medicine | Admitting: Emergency Medicine

## 2016-03-06 DIAGNOSIS — M79672 Pain in left foot: Secondary | ICD-10-CM | POA: Diagnosis not present

## 2016-03-06 DIAGNOSIS — Z780 Asymptomatic menopausal state: Secondary | ICD-10-CM | POA: Diagnosis present

## 2016-03-06 DIAGNOSIS — M79671 Pain in right foot: Secondary | ICD-10-CM

## 2016-03-06 DIAGNOSIS — Z79899 Other long term (current) drug therapy: Secondary | ICD-10-CM | POA: Insufficient documentation

## 2016-03-06 DIAGNOSIS — M25562 Pain in left knee: Secondary | ICD-10-CM | POA: Insufficient documentation

## 2016-03-06 DIAGNOSIS — F17210 Nicotine dependence, cigarettes, uncomplicated: Secondary | ICD-10-CM | POA: Insufficient documentation

## 2016-03-06 DIAGNOSIS — Z13820 Encounter for screening for osteoporosis: Secondary | ICD-10-CM

## 2016-03-06 DIAGNOSIS — M25561 Pain in right knee: Secondary | ICD-10-CM | POA: Diagnosis present

## 2016-03-06 DIAGNOSIS — R52 Pain, unspecified: Secondary | ICD-10-CM

## 2016-03-06 HISTORY — DX: Obstructive sleep apnea (adult) (pediatric): G47.33

## 2016-03-06 LAB — CBG MONITORING, ED: Glucose-Capillary: 116 mg/dL — ABNORMAL HIGH (ref 65–99)

## 2016-03-06 MED ORDER — IBUPROFEN 800 MG PO TABS: 800.0000 mg | ORAL_TABLET | Freq: Three times a day (TID) | ORAL | 0 refills | Status: AC

## 2016-03-06 MED ORDER — METHOCARBAMOL 500 MG PO TABS: 500.0000 mg | ORAL_TABLET | Freq: Two times a day (BID) | ORAL | 0 refills | Status: AC

## 2016-03-06 NOTE — ED Notes (Signed)
Pt made aware to return if symptoms worsen or if any life threatening symptoms occur.   

## 2016-03-06 NOTE — ED Triage Notes (Signed)
Bilateral leg pain times 3 weeks

## 2016-03-06 NOTE — ED Notes (Signed)
Told karen, pa about bp 174/94, pt told to follow up with pcp

## 2016-03-06 NOTE — Discharge Instructions (Signed)
See your Physician for evaluation of feet numbness and knee pain.  Your Physician may want to refer you to an Orthopaedist if knee pain persist

## 2016-03-06 NOTE — ED Provider Notes (Signed)
[facility]-EM[facility]Y DEPT Provider Note   CSN: [id number] Arrival date & time: 03/06/16  1433     History   Chief Complaint Chief Complaint  Patient presents with  . Leg Pain    HPI [name] is a 56 y.o. female.  The history is provided by the patient. No language interpreter was used.  Leg Pain   This is a new problem. The problem occurs constantly. The quality of the pain is described as aching. The pain is moderate. Pertinent negatives include full range of motion. She has tried nothing for the symptoms. The treatment provided no relief. There has been no history of extremity trauma.  Pt complains of pain in both knees and numbness in both feet.  Pt concerned that she has vascular disease.   Past Medical History:  Diagnosis Date  . OSA (obstructive sleep apnea)     There are no active problems to display for this patient.   Past Surgical History:  Procedure Laterality Date  . ABDOMINAL HYSTERECTOMY    . APPENDECTOMY    . INCONTINENCE SURGERY    . TONSILLECTOMY      OB History    No data available       Home Medications    Prior to Admission medications   Medication Sig Start Date End Date Taking? Authorizing Provider  albuterol (PROVENTIL HFA;VENTOLIN HFA) 108 (90 Base) MCG/ACT inhaler Inhale 1-2 puffs into the lungs every 6 (six) hours as needed for wheezing or shortness of breath. 04/05/15   Elson Areas, PA-C  ibuprofen (ADVIL,MOTRIN) 800 MG tablet Take 1 tablet (800 mg total) by mouth 3 (three) times daily. 03/06/16   Elson Areas, PA-C  loratadine (CLARITIN) 10 MG tablet Take 10 mg by mouth daily.    Historical Provider, MD  methocarbamol (ROBAXIN) 500 MG tablet Take 1 tablet (500 mg total) by mouth 2 (two) times daily. 03/06/16   Elson Areas, PA-C  Omeprazole 20 MG TBEC Take 1 tablet by mouth daily.  08/12/08   Historical Provider, MD  temazepam (RESTORIL) 15 MG capsule Take 15 mg by mouth at bedtime as needed for sleep.    Historical Provider, MD     Family History No family history on file.  Social History Social History  Substance Use Topics  . Smoking status: Current Every Day Smoker    Packs/day: 0.50    Types: Cigarettes  . Smokeless tobacco: Not on file  . Alcohol use No     Allergies   Doxycycline   Review of Systems Review of Systems  All other systems reviewed and are negative.    Physical Exam Updated Vital Signs BP 174/97 (BP Location: Left Arm)   Pulse 65   Temp 98.1 F (36.7 C) (Oral)   Resp 16   Ht 5\' 8"  (1.727 m)   Wt 83.9 kg   SpO2 100%   BMI 28.13 kg/m   Physical Exam  Constitutional: She appears well-developed and well-nourished. No distress.  HENT:  Head: Normocephalic and atraumatic.  Eyes: Conjunctivae are normal.  Neck: Neck supple.  Cardiovascular:  No murmur heard. Pulmonary/Chest: No respiratory distress.  Abdominal: Soft. There is no tenderness.  Musculoskeletal: She exhibits tenderness. She exhibits no edema.  Tender bilat knees, nop deformity,  Popping with range of motion of left knee.  Good pulses bilat feet.  No swelling.  Neurological: She is alert.  Skin: Skin is warm and dry.  Psychiatric: She has a normal mood and affect.  Nursing  note and vitals reviewed.    ED Treatments / Results  Labs (all labs ordered are listed, but only abnormal results are displayed) Labs Reviewed  CBG MONITORING, ED - Abnormal; Notable for the following:       Result Value   Glucose-Capillary 116 (*)    All other components within normal limits    EKG  EKG Interpretation None       Radiology Dg Knee 1-2 Views Left  Result Date: 03/06/2016 CLINICAL DATA:  Bilateral knee pain, left greater than right EXAM: LEFT KNEE - 1-2 VIEW COMPARISON:  None. FINDINGS: No evidence of fracture, dislocation, or joint effusion. No evidence of arthropathy or other focal bone abnormality. Soft tissues are unremarkable. IMPRESSION: Negative left knee. Electronically Signed   By: Marnee Spring M.D.   On: 03/06/2016 16:06   Dg Knee 2 Views Right  Result Date: 03/06/2016 CLINICAL DATA:  Acute onset of right knee pain.  Initial encounter. EXAM: RIGHT KNEE - 1-2 VIEW COMPARISON:  None. FINDINGS: There is no evidence of fracture or dislocation. The joint spaces are preserved. There is mild squaring at the medial compartment, reflecting minimal degenerative change. The patellofemoral compartment is grossly unremarkable. Trace knee joint fluid remains within normal limits. The visualized soft tissues are normal in appearance. IMPRESSION: No evidence of fracture or dislocation. Electronically Signed   By: Roanna Raider M.D.   On: 03/06/2016 16:23   Dg Bone Density (dxa)  Result Date: 03/06/2016 EXAM: DUAL X-RAY ABSORPTIOMETRY (DXA) FOR BONE MINERAL DENSITY IMPRESSION: Ordering Physician:  Dr. Smith Robert, Your patient [name] completed a BMD test on 03/06/2016 using the Lunar Prodigy DXA System (software version: 14.10) manufactured by [employer]. The following summarizes the results of our evaluation. PATIENT BIOGRAPHICAL: Name: [name] Patient ID: [id number] Birth Date: [birth year] Height: 68.0 in. Gender: Female Exam Date: 03/06/2016 Weight: 185.0 lbs. Indications: Bilateral Oophrectomy, Caucasian, Low Calcium Intake, Post Menopausal, Tobacco User Fractures: Treatments: DENSITOMETRY RESULTS: Site      Region    Measured Date Measured Age WHO Classification Young Adult T-score BMD         %Change vs. Previous Significant Change (*) [facility] Spine L1-L2 03/06/2016 56.9 Normal 0.2 1.185 g/cm2 DualFemur Neck Left 03/06/2016 56.9 Normal 0.6 1.120 g/cm2 ASSESSMENT: BMD as determined from [facility] Spine L1-L2 is 1.185 g/cm2 with a T-Score of 0.2. This patient is considered normal according to World Health Organization [place]) criteria. (Patient is not a candidate for FRAX assessment due to normal bone density exam.) World Health Organization [facility]) criteria for post-menopausal, Caucasian  Women: Normal:       T-score at or above -1 SD Osteopenia:   T-score between -1 and -2.5 SD Osteoporosis: T-score at or below -2.5 SD RECOMMENDATIONS: National Osteoporosis Foundation recommends that FDA-approved medial therapies be considered in postmenopausal women and men age 13 or older with a: 1. Hip or vertebral (clinical or morphometric) fracture. 2. T-Score of < -2.5 at the spine or hip. 3. Ten-year fracture probability by FRAX of 3% or greater for hip fracture or 20% or greater for major osteoporotic fracture. All treatment decisions require clinical judgment and consideration of individual patient factors, including patient preferences, co-morbidities, previous drug use, risk factors not captured in the FRAX model (e.g. falls, vitamin D deficiency, increased bone turnover, interval significant decline in bone density) and possible under-or over-estimation of fracture risk by FRAX. All patients should ensure an adequate intake of dietary calcium (1200 mg/d) and vitamin D (800  IU daily) unless contraindicated. FOLLOW-UP: People with diagnosed cases of osteoporosis or osteopenia should be regularly tested for bone mineral density. For patients eligible for Medicare, routine testing is allowed once every 2 years. Testing frequency can be increased for patients who have rapidly progressing disease, or for those who are receiving medical therapy to restore bone mass. I have reviewed this report, and agree with the above findings. [facility] Electronically Signed   By: Natasha MeadLiviu  Pop M.D.   On: 03/06/2016 14:53    Procedures Procedures (including critical care time)  Medications Ordered in ED Medications - No data to display   Initial Impression / Assessment and Plan / ED Course  I have reviewed the triage vital signs and the nursing notes.  Pertinent labs & imaging results that were available during my care of the patient were reviewed by me and considered in my medical decision making  (see chart for details).  Clinical Course as of Mar 06 1645  Wed Mar 06, 2016  1638 DG Knee 1-2 Views Left [LS]  1639 DG Knee 2 Views Right [LS]  1639 DG Knee 2 Views Right [LS]    Clinical Course User Index [LS] Elson AreasLeslie K Kashus Karlen, PA-C    Pt advised to follow up with her MD.  Pt has multiple concerns.  She does not have any emergent.  Pt advised she needs to see her Md for evaluation of multiple concerns.  Final Clinical Impressions(s) / ED Diagnoses   Final diagnoses:  Pain in both knees, unspecified chronicity  Foot pain, bilateral    New Prescriptions New Prescriptions   METHOCARBAMOL (ROBAXIN) 500 MG TABLET    Take 1 tablet (500 mg total) by mouth 2 (two) times daily.     Lonia SkinnerLeslie K Big RockSofia, PA-C 03/06/16 1654    Samuel JesterKathleen McManus, DO 03/08/16 2013

## 2016-04-10 ENCOUNTER — Ambulatory Visit: Payer: Self-pay | Admitting: Orthopaedic Surgery

## 2016-04-17 ENCOUNTER — Ambulatory Visit: Payer: Self-pay | Admitting: Orthopaedic Surgery

## 2016-04-23 ENCOUNTER — Ambulatory Visit (INDEPENDENT_AMBULATORY_CARE_PROVIDER_SITE_OTHER): Payer: Medicaid Other | Admitting: Orthopaedic Surgery

## 2016-04-23 ENCOUNTER — Encounter: Payer: Self-pay | Admitting: Orthopaedic Surgery

## 2016-04-23 VITALS — BP 153/92 | HR 87 | Temp 97.5°F | Ht 68.0 in | Wt 187.0 lb

## 2016-04-23 DIAGNOSIS — M25562 Pain in left knee: Secondary | ICD-10-CM | POA: Diagnosis not present

## 2016-04-23 DIAGNOSIS — F17210 Nicotine dependence, cigarettes, uncomplicated: Secondary | ICD-10-CM | POA: Diagnosis not present

## 2016-04-23 NOTE — Patient Instructions (Signed)
Steps to Quit Smoking Smoking tobacco can be bad for your health. It can also affect almost every organ in your body. Smoking puts you and people around you at risk for many serious long-lasting (chronic) diseases. Quitting smoking is hard, but it is one of the best things that you can do for your health. It is never too late to quit. What are the benefits of quitting smoking? When you quit smoking, you lower your risk for getting serious diseases and conditions. They can include:  Lung cancer or lung disease.  Heart disease.  Stroke.  Heart attack.  Not being able to have children (infertility).  Weak bones (osteoporosis) and broken bones (fractures). If you have coughing, wheezing, and shortness of breath, those symptoms may get better when you quit. You may also get sick less often. If you are pregnant, quitting smoking can help to lower your chances of having a baby of low birth weight. What can I do to help me quit smoking? Talk with your doctor about what can help you quit smoking. Some things you can do (strategies) include:  Quitting smoking totally, instead of slowly cutting back how much you smoke over a period of time.  Going to in-person counseling. You are more likely to quit if you go to many counseling sessions.  Using resources and support systems, such as:  Online chats with a counselor.  Phone quitlines.  Printed self-help materials.  Support groups or group counseling.  Text messaging programs.  Mobile phone apps or applications.  Taking medicines. Some of these medicines may have nicotine in them. If you are pregnant or breastfeeding, do not take any medicines to quit smoking unless your doctor says it is okay. Talk with your doctor about counseling or other things that can help you. Talk with your doctor about using more than one strategy at the same time, such as taking medicines while you are also going to in-person counseling. This can help make quitting  easier. What things can I do to make it easier to quit? Quitting smoking might feel very hard at first, but there is a lot that you can do to make it easier. Take these steps:  Talk to your family and friends. Ask them to support and encourage you.  Call phone quitlines, reach out to support groups, or work with a counselor.  Ask people who smoke to not smoke around you.  Avoid places that make you want (trigger) to smoke, such as:  Bars.  Parties.  Smoke-break areas at work.  Spend time with people who do not smoke.  Lower the stress in your life. Stress can make you want to smoke. Try these things to help your stress:  Getting regular exercise.  Deep-breathing exercises.  Yoga.  Meditating.  Doing a body scan. To do this, close your eyes, focus on one area of your body at a time from head to toe, and notice which parts of your body are tense. Try to relax the muscles in those areas.  Download or buy apps on your mobile phone or tablet that can help you stick to your quit plan. There are many free apps, such as QuitGuide from the CDC (Centers for Disease Control and Prevention). You can find more support from smokefree.gov and other websites. This information is not intended to replace advice given to you by your health care provider. Make sure you discuss any questions you have with your health care provider. Document Released: 10/27/2008 Document Revised: 08/29/2015 Document   Reviewed: 05/17/2014 Elsevier Interactive Patient Education  2017 Elsevier Inc.  

## 2016-04-23 NOTE — Progress Notes (Signed)
Subjective:    Patient ID: [name], female    DOB: 10/27/1960, 56 y.o.   MRN: [id number]  HPI She has had pain in the left and right knees over the years.  She has had dislocation of patella in both knees years ago.  Over the period from the beginning of this year, her left knee has been hurting more and more.  She has swelling and popping.  She has begun to have giving way of the left knee over the last few weeks.  She has no redness, no trauma.  She has tried Advil and ice and heat with no help.  She was seen in the ER for this on 03-06-16.  X-rays then were negative.  I have reviewed the ER records, the x-rays and x-ray reports.  She has been seen by her primary care doctor at [facility] and asked to come here for this.  She is tired of her knee being painful and giving way.  I am concerned about a meniscus injury and that she may need surgery.  I will order a MRI of the left knee.  She smokes and is willing to cut back and quit.  Review of Systems  HENT: Negative for congestion.   Respiratory: Negative for cough and shortness of breath.   Cardiovascular: Negative for chest pain and leg swelling.  Endocrine: Positive for cold intolerance.  Musculoskeletal: Positive for arthralgias, gait problem and joint swelling.  Allergic/Immunologic: Positive for environmental allergies.   Past Medical History:  Diagnosis Date  . GERD (gastroesophageal reflux disease)   . OSA (obstructive sleep apnea)     Past Surgical History:  Procedure Laterality Date  . ABDOMINAL HYSTERECTOMY    . APPENDECTOMY    . INCONTINENCE SURGERY    . TONSILLECTOMY      Current Outpatient Prescriptions on File Prior to Visit  Medication Sig Dispense Refill  . albuterol (PROVENTIL HFA;VENTOLIN HFA) 108 (90 Base) MCG/ACT inhaler Inhale 1-2 puffs into the lungs every 6 (six) hours as needed for wheezing or shortness of breath. (Patient not taking: Reported on 04/23/2016) 1 Inhaler 0   . ibuprofen (ADVIL,MOTRIN) 800 MG tablet Take 1 tablet (800 mg total) by mouth 3 (three) times daily. (Patient not taking: Reported on 04/23/2016) 21 tablet 0  . loratadine (CLARITIN) 10 MG tablet Take 10 mg by mouth daily.    . methocarbamol (ROBAXIN) 500 MG tablet Take 1 tablet (500 mg total) by mouth 2 (two) times daily. (Patient not taking: Reported on 04/23/2016) 20 tablet 0  . Omeprazole 20 MG TBEC Take 1 tablet by mouth daily.     . temazepam (RESTORIL) 15 MG capsule Take 15 mg by mouth at bedtime as needed for sleep.     No current facility-administered medications on file prior to visit.     Social History   Social History  . Marital status: Legally Separated    Spouse name: N/A  . Number of children: N/A  . Years of education: N/A   Occupational History  . Not on file.   Social History Main Topics  . Smoking status: Current Every Day Smoker    Packs/day: 0.50    Types: Cigarettes  . Smokeless tobacco: Never Used  . Alcohol use No  . Drug use: No  . Sexual activity: Not on file   Other Topics Concern  . Not on file   Social History Narrative  . No narrative on file    Family History  Problem Relation Age of Onset  . Cancer Sister     BP (!) 153/92   Pulse 87   Temp 97.5 F (36.4 C)   Ht  (1.727 m)   Wt 187 lb (84.8 kg)   BMI 28.43 kg/m      Objective:   Physical Exam  Constitutional: She is oriented to person, place, and time. She appears well-developed and well-nourished.  HENT:  Head: Normocephalic and atraumatic.  Eyes: Conjunctivae and EOM are normal. Pupils are equal, round, and reactive to light.  Neck: Normal range of motion. Neck supple.  Cardiovascular: Normal rate, regular rhythm and intact distal pulses.   Pulmonary/Chest: Effort normal.  Abdominal: Soft.  Musculoskeletal: She exhibits tenderness (Left knee with 1+ effusion, pain, ROM 0 to 105, positive medial McMurray, stable, crepitus is present, NV intact.  Limp to the left.   Right knee negative.).  Neurological: She is alert and oriented to person, place, and time. She displays normal reflexes. No cranial nerve deficit. She exhibits normal muscle tone. Coordination normal.  Skin: Skin is warm and dry.  Psychiatric: She has a normal mood and affect. Her behavior is normal. Judgment and thought content normal.  Vitals reviewed.         Assessment & Plan:   Encounter Diagnoses  Name Primary?  . Acute pain of left knee Yes  . Cigarette nicotine dependence without complication    I would like to get MRI of the left knee as she has not improved with conservative treatment since 03-06-16, has giving way of the knee and has positive medial McMurray.  I am concerned she has a meniscus injury and would benefit from arthroscopy of the knee.  Return after MRI.  Call if any problem.  Precautions discussed.  Electronically Signed Darreld Mclean, MD 4/10/201810:11 AM

## 2016-05-01 ENCOUNTER — Ambulatory Visit: Payer: Medicaid Other | Admitting: Orthopaedic Surgery

## 2016-05-07 ENCOUNTER — Ambulatory Visit: Payer: Medicaid Other | Admitting: Orthopaedic Surgery

## 2016-05-08 ENCOUNTER — Ambulatory Visit (INDEPENDENT_AMBULATORY_CARE_PROVIDER_SITE_OTHER): Payer: Medicaid Other | Admitting: Orthopaedic Surgery

## 2016-05-08 ENCOUNTER — Encounter: Payer: Self-pay | Admitting: Orthopaedic Surgery

## 2016-05-08 VITALS — BP 137/82 | HR 90 | Temp 97.7°F | Ht 68.0 in | Wt 183.0 lb

## 2016-05-08 DIAGNOSIS — M25562 Pain in left knee: Secondary | ICD-10-CM

## 2016-05-08 DIAGNOSIS — F17210 Nicotine dependence, cigarettes, uncomplicated: Secondary | ICD-10-CM | POA: Diagnosis not present

## 2016-05-08 NOTE — Progress Notes (Signed)
Patient [name], female DOB:Aug 22, 1960, 56 y.o. NWG:[id number]  Chief Complaint  Patient presents with  . Knee Pain    left  MRI results    HPI  [name] is a 56 y.o. female who has pain of the left knee with swelling and giving way.  She had MRI done which shows posterior horn tear of the medial meniscus on the left.  I explained the findings to her.  I have recommended arthroscopy of the knee by Dr. Romeo Apple.  She is agreeable to this. HPI  Body mass index is 27.83 kg/m.  ROS  Review of Systems  HENT: Negative for congestion.   Respiratory: Negative for cough and shortness of breath.   Cardiovascular: Negative for chest pain and leg swelling.  Endocrine: Positive for cold intolerance.  Musculoskeletal: Positive for arthralgias, gait problem and joint swelling.  Allergic/Immunologic: Positive for environmental allergies.    Past Medical History:  Diagnosis Date  . GERD (gastroesophageal reflux disease)   . OSA (obstructive sleep apnea)     Past Surgical History:  Procedure Laterality Date  . ABDOMINAL HYSTERECTOMY    . APPENDECTOMY    . INCONTINENCE SURGERY    . TONSILLECTOMY      Family History  Problem Relation Age of Onset  . Cancer Sister     Social History Social History  Substance Use Topics  . Smoking status: Current Every Day Smoker    Packs/day: 0.50    Types: Cigarettes  . Smokeless tobacco: Never Used  . Alcohol use No    Allergies  Allergen Reactions  . Doxycycline     Current Outpatient Prescriptions  Medication Sig Dispense Refill  . albuterol (PROVENTIL HFA;VENTOLIN HFA) 108 (90 Base) MCG/ACT inhaler Inhale 1-2 puffs into the lungs every 6 (six) hours as needed for wheezing or shortness of breath. (Patient not taking: Reported on 04/23/2016) 1 Inhaler 0  . amitriptyline (ELAVIL) 25 MG tablet Take 25 mg by mouth at bedtime.    Marland Kitchen ibuprofen (ADVIL,MOTRIN) 800 MG tablet Take 1 tablet (800 mg total) by mouth 3 (three) times  daily. (Patient not taking: Reported on 04/23/2016) 21 tablet 0  . loratadine (CLARITIN) 10 MG tablet Take 10 mg by mouth daily.    . methocarbamol (ROBAXIN) 500 MG tablet Take 1 tablet (500 mg total) by mouth 2 (two) times daily. (Patient not taking: Reported on 04/23/2016) 20 tablet 0  . Omeprazole 20 MG TBEC Take 1 tablet by mouth daily.     . temazepam (RESTORIL) 15 MG capsule Take 15 mg by mouth at bedtime as needed for sleep.    . varenicline (CHANTIX PAK) 0.5 MG X 11 & 1 MG X 42 tablet Take by mouth 2 (two) times daily. Take one 0.5 mg tablet by mouth once daily for 3 days, then increase to one 0.5 mg tablet twice daily for 4 days, then increase to one 1 mg tablet twice daily.     No current facility-administered medications for this visit.      Physical Exam  Blood pressure 137/82, pulse 90, temperature 97.7 F (36.5 C), height  (1.727 m), weight 183 lb (83 kg).  Constitutional: overall normal hygiene, normal nutrition, well developed, normal grooming, normal body habitus. Assistive device:none  Musculoskeletal: gait and station Limp left, muscle tone and strength are normal, no tremors or atrophy is present.  .  Neurological: coordination overall normal.  Deep tendon reflex/nerve stretch intact.  Sensation normal.  Cranial nerves II-XII intact.  Skin:   Normal overall no scars, lesions, ulcers or rashes. No psoriasis.  Psychiatric: Alert and oriented x 3.  Recent memory intact, remote memory unclear.  Normal mood and affect. Well groomed.  Good eye contact.  Cardiovascular: overall no swelling, no varicosities, no edema bilaterally, normal temperatures of the legs and arms, no clubbing, cyanosis and good capillary refill.  Lymphatic: palpation is normal.  Her left knee has effusion 1+, positive medial McMurray and medial joint line pain.  NV intact.  She still smokes and needs to cut back.  The patient has been educated about the nature of the problem(s) and counseled  on treatment options.  The patient appeared to understand what I have discussed and is in agreement with it.  Encounter Diagnoses  Name Primary?  . Acute pain of left knee Yes  . Cigarette nicotine dependence without complication     PLAN Call if any problems.  Precautions discussed.  Continue current medications.   Return to clinic to see Dr. Romeo Apple for surgery evaluation.   Electronically Signed Darreld Mclean, MD 4/25/20182:51 PM

## 2016-05-08 NOTE — Patient Instructions (Signed)
Steps to Quit Smoking Smoking tobacco can be bad for your health. It can also affect almost every organ in your body. Smoking puts you and people around you at risk for many serious long-lasting (chronic) diseases. Quitting smoking is hard, but it is one of the best things that you can do for your health. It is never too late to quit. What are the benefits of quitting smoking? When you quit smoking, you lower your risk for getting serious diseases and conditions. They can include:  Lung cancer or lung disease.  Heart disease.  Stroke.  Heart attack.  Not being able to have children (infertility).  Weak bones (osteoporosis) and broken bones (fractures). If you have coughing, wheezing, and shortness of breath, those symptoms may get better when you quit. You may also get sick less often. If you are pregnant, quitting smoking can help to lower your chances of having a baby of low birth weight. What can I do to help me quit smoking? Talk with your doctor about what can help you quit smoking. Some things you can do (strategies) include:  Quitting smoking totally, instead of slowly cutting back how much you smoke over a period of time.  Going to in-person counseling. You are more likely to quit if you go to many counseling sessions.  Using resources and support systems, such as:  Online chats with a counselor.  Phone quitlines.  Printed self-help materials.  Support groups or group counseling.  Text messaging programs.  Mobile phone apps or applications.  Taking medicines. Some of these medicines may have nicotine in them. If you are pregnant or breastfeeding, do not take any medicines to quit smoking unless your doctor says it is okay. Talk with your doctor about counseling or other things that can help you. Talk with your doctor about using more than one strategy at the same time, such as taking medicines while you are also going to in-person counseling. This can help make quitting  easier. What things can I do to make it easier to quit? Quitting smoking might feel very hard at first, but there is a lot that you can do to make it easier. Take these steps:  Talk to your family and friends. Ask them to support and encourage you.  Call phone quitlines, reach out to support groups, or work with a counselor.  Ask people who smoke to not smoke around you.  Avoid places that make you want (trigger) to smoke, such as:  Bars.  Parties.  Smoke-break areas at work.  Spend time with people who do not smoke.  Lower the stress in your life. Stress can make you want to smoke. Try these things to help your stress:  Getting regular exercise.  Deep-breathing exercises.  Yoga.  Meditating.  Doing a body scan. To do this, close your eyes, focus on one area of your body at a time from head to toe, and notice which parts of your body are tense. Try to relax the muscles in those areas.  Download or buy apps on your mobile phone or tablet that can help you stick to your quit plan. There are many free apps, such as QuitGuide from the CDC (Centers for Disease Control and Prevention). You can find more support from smokefree.gov and other websites. This information is not intended to replace advice given to you by your health care provider. Make sure you discuss any questions you have with your health care provider. Document Released: 10/27/2008 Document Revised: 08/29/2015 Document   Reviewed: 05/17/2014 Elsevier Interactive Patient Education  2017 Elsevier Inc.  

## 2016-05-27 ENCOUNTER — Telehealth: Payer: Self-pay | Admitting: Orthopaedic Surgery

## 2016-05-27 NOTE — Telephone Encounter (Signed)
Evelyn PoundDeborah canceled the appointment for the 16th.  She said she doesnt want to have surgery right now.  She will call back to reschedule

## 2016-05-29 ENCOUNTER — Ambulatory Visit: Payer: Medicaid Other | Admitting: Orthopedic Surgery

## 2016-07-10 ENCOUNTER — Ambulatory Visit: Payer: Medicaid Other | Admitting: Orthopedic Surgery

## 2019-08-12 ENCOUNTER — Other Ambulatory Visit: Payer: Self-pay | Admitting: Emergency Medicine

## 2019-08-12 DIAGNOSIS — Z720 Tobacco use: Secondary | ICD-10-CM

## 2019-09-03 ENCOUNTER — Other Ambulatory Visit (HOSPITAL_COMMUNITY): Payer: Self-pay | Admitting: Emergency Medicine

## 2019-09-03 DIAGNOSIS — N644 Mastodynia: Secondary | ICD-10-CM

## 2019-09-06 ENCOUNTER — Ambulatory Visit (HOSPITAL_COMMUNITY): Payer: Medicare Other

## 2019-09-08 ENCOUNTER — Other Ambulatory Visit (HOSPITAL_COMMUNITY): Payer: Self-pay | Admitting: Emergency Medicine

## 2019-09-08 DIAGNOSIS — N644 Mastodynia: Secondary | ICD-10-CM

## 2019-09-15 ENCOUNTER — Other Ambulatory Visit (HOSPITAL_COMMUNITY): Payer: Self-pay | Admitting: Emergency Medicine

## 2019-09-15 ENCOUNTER — Inpatient Hospital Stay
Admission: RE | Admit: 2019-09-15 | Discharge: 2019-09-15 | Disposition: A | Discharge status: Home / Self Care | Payer: Self-pay | Source: Ambulatory Visit | Attending: Emergency Medicine | Admitting: Emergency Medicine

## 2019-09-15 DIAGNOSIS — N644 Mastodynia: Secondary | ICD-10-CM

## 2019-09-28 ENCOUNTER — Ambulatory Visit (HOSPITAL_COMMUNITY): Payer: Medicare Other

## 2019-09-28 ENCOUNTER — Encounter (HOSPITAL_COMMUNITY): Payer: Medicare Other

## 2019-09-29 ENCOUNTER — Ambulatory Visit (HOSPITAL_COMMUNITY): Payer: Medicare Other

## 2019-10-18 ENCOUNTER — Ambulatory Visit (HOSPITAL_COMMUNITY): Payer: Medicare Other

## 2019-10-19 ENCOUNTER — Inpatient Hospital Stay (HOSPITAL_COMMUNITY): Admission: RE | Admit: 2019-10-19 | Payer: Medicare Other | Source: Ambulatory Visit

## 2019-10-19 ENCOUNTER — Ambulatory Visit (HOSPITAL_COMMUNITY): Admission: RE | Admit: 2019-10-19 | Payer: Medicare Other | Source: Ambulatory Visit

## 2019-10-19 ENCOUNTER — Ambulatory Visit (HOSPITAL_COMMUNITY): Payer: Medicare Other | Attending: Emergency Medicine

## 2019-10-19 ENCOUNTER — Encounter (HOSPITAL_COMMUNITY): Payer: Self-pay

## 2019-11-30 ENCOUNTER — Ambulatory Visit (HOSPITAL_COMMUNITY): Payer: Medicare Other

## 2019-11-30 ENCOUNTER — Ambulatory Visit (HOSPITAL_COMMUNITY): Admission: RE | Admit: 2019-11-30 | Payer: Medicare Other | Source: Ambulatory Visit

## 2019-11-30 ENCOUNTER — Encounter (HOSPITAL_COMMUNITY): Payer: Self-pay

## 2019-11-30 ENCOUNTER — Encounter (HOSPITAL_COMMUNITY): Payer: Medicare Other

## 2020-01-19 ENCOUNTER — Other Ambulatory Visit (HOSPITAL_COMMUNITY): Payer: Self-pay | Admitting: Emergency Medicine

## 2020-01-25 ENCOUNTER — Ambulatory Visit (HOSPITAL_COMMUNITY)
Admission: RE | Admit: 2020-01-25 | Discharge: 2020-01-25 | Disposition: A | Discharge status: Home / Self Care | Payer: Medicare Other | Source: Ambulatory Visit | Attending: Emergency Medicine | Admitting: Emergency Medicine

## 2020-01-25 ENCOUNTER — Other Ambulatory Visit: Payer: Self-pay

## 2020-01-25 DIAGNOSIS — N644 Mastodynia: Secondary | ICD-10-CM

## 2020-02-02 ENCOUNTER — Ambulatory Visit (HOSPITAL_COMMUNITY): Payer: Medicare Other

## 2020-02-28 ENCOUNTER — Ambulatory Visit (HOSPITAL_COMMUNITY): Payer: Medicare Other

## 2020-03-09 ENCOUNTER — Other Ambulatory Visit: Payer: Self-pay

## 2020-03-09 ENCOUNTER — Ambulatory Visit (HOSPITAL_COMMUNITY)
Admission: RE | Admit: 2020-03-09 | Discharge: 2020-03-09 | Disposition: A | Discharge status: Home / Self Care | Payer: Medicare Other | Source: Ambulatory Visit | Attending: Emergency Medicine | Admitting: Emergency Medicine

## 2020-03-09 DIAGNOSIS — Z720 Tobacco use: Secondary | ICD-10-CM

## 2021-08-06 ENCOUNTER — Other Ambulatory Visit: Payer: Self-pay | Admitting: Emergency Medicine

## 2021-08-06 ENCOUNTER — Other Ambulatory Visit (HOSPITAL_COMMUNITY): Payer: Self-pay | Admitting: Emergency Medicine

## 2021-08-06 DIAGNOSIS — Z720 Tobacco use: Secondary | ICD-10-CM

## 2021-08-17 ENCOUNTER — Encounter (HOSPITAL_COMMUNITY): Payer: Self-pay

## 2021-08-17 ENCOUNTER — Ambulatory Visit (HOSPITAL_COMMUNITY): Payer: Medicare Other

## 2021-09-20 ENCOUNTER — Ambulatory Visit (HOSPITAL_COMMUNITY): Payer: Medicare Other

## 2021-10-29 ENCOUNTER — Ambulatory Visit (HOSPITAL_COMMUNITY)
Admission: RE | Admit: 2021-10-29 | Discharge: 2021-10-29 | Disposition: A | Discharge status: Home / Self Care | Payer: Medicare Other | Source: Ambulatory Visit | Attending: Emergency Medicine | Admitting: Emergency Medicine

## 2021-10-29 DIAGNOSIS — Z720 Tobacco use: Secondary | ICD-10-CM | POA: Insufficient documentation

## 2021-10-29 DIAGNOSIS — F17210 Nicotine dependence, cigarettes, uncomplicated: Secondary | ICD-10-CM | POA: Insufficient documentation

## 2021-10-29 DIAGNOSIS — Z122 Encounter for screening for malignant neoplasm of respiratory organs: Secondary | ICD-10-CM | POA: Diagnosis not present

## 2021-12-26 ENCOUNTER — Ambulatory Visit: Payer: Medicare Other | Admitting: Podiatry

## 2022-01-02 ENCOUNTER — Ambulatory Visit: Payer: Medicare Other | Admitting: Podiatry

## 2022-01-21 ENCOUNTER — Ambulatory Visit: Payer: Medicare Other | Admitting: Podiatry

## 2022-02-04 ENCOUNTER — Ambulatory Visit: Payer: 59 | Admitting: Podiatry

## 2022-06-03 IMAGING — MG DIGITAL DIAGNOSTIC BILAT W/ TOMO W/ CAD
8 series · 8 of 24 positions shown · non-contrast
Comparison: Previous exams.

CLINICAL DATA: 59-year-old female with left nipple inversion which
she states has been present for proximally 3 years. She is able to
[place] the left nipple without difficulty. No palpable areas of
concern.

EXAM:
DIGITAL DIAGNOSTIC BILATERAL MAMMOGRAM WITH TOMO AND CAD; ULTRASOUND
LEFT BREAST LIMITED

[L CC synth-2D]
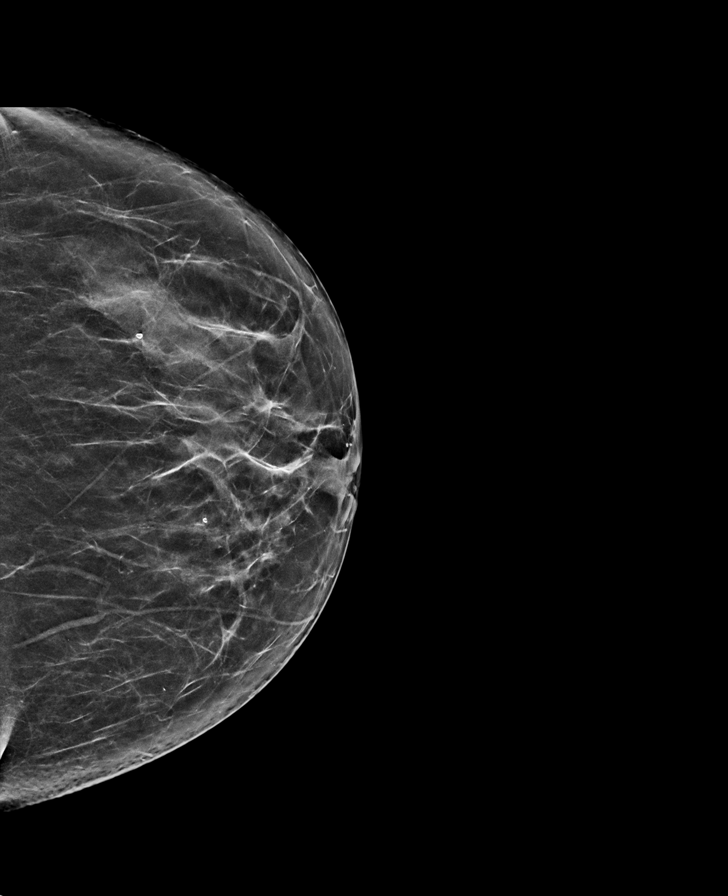

[R CC synth-2D]
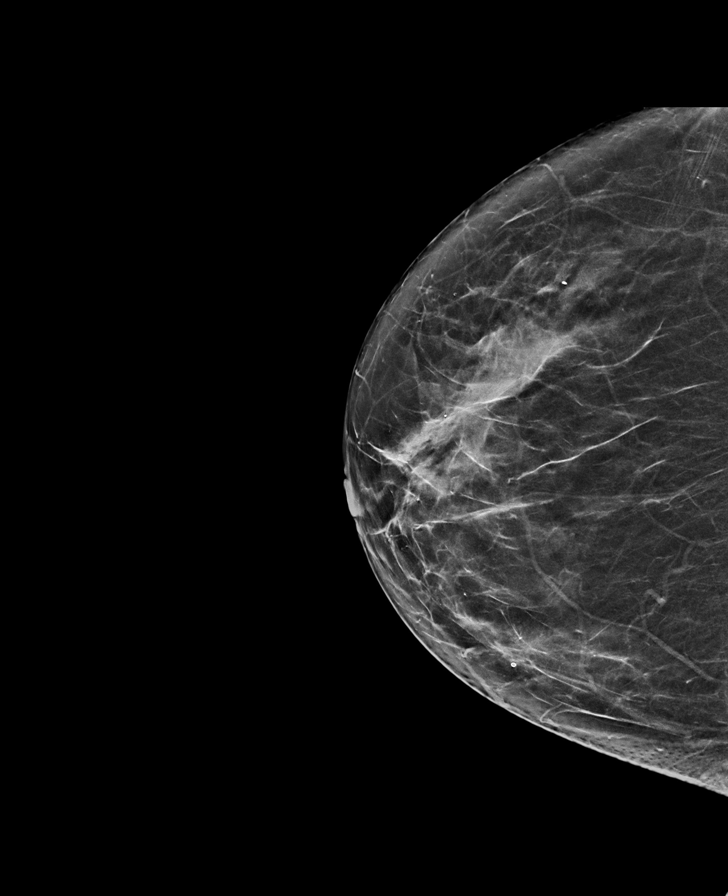

[L MLO synth-2D]
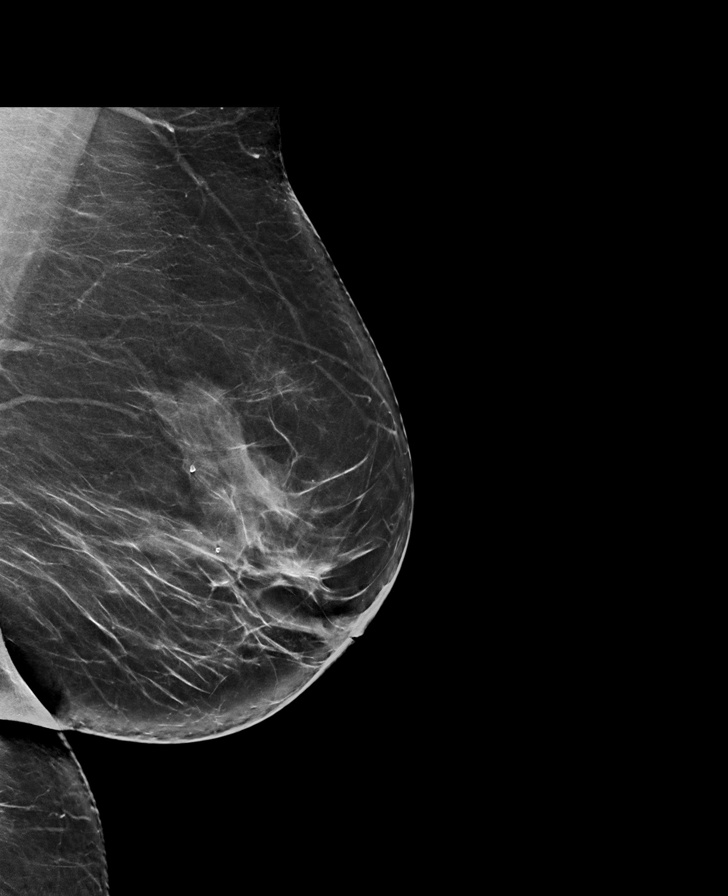

[R MLO synth-2D]
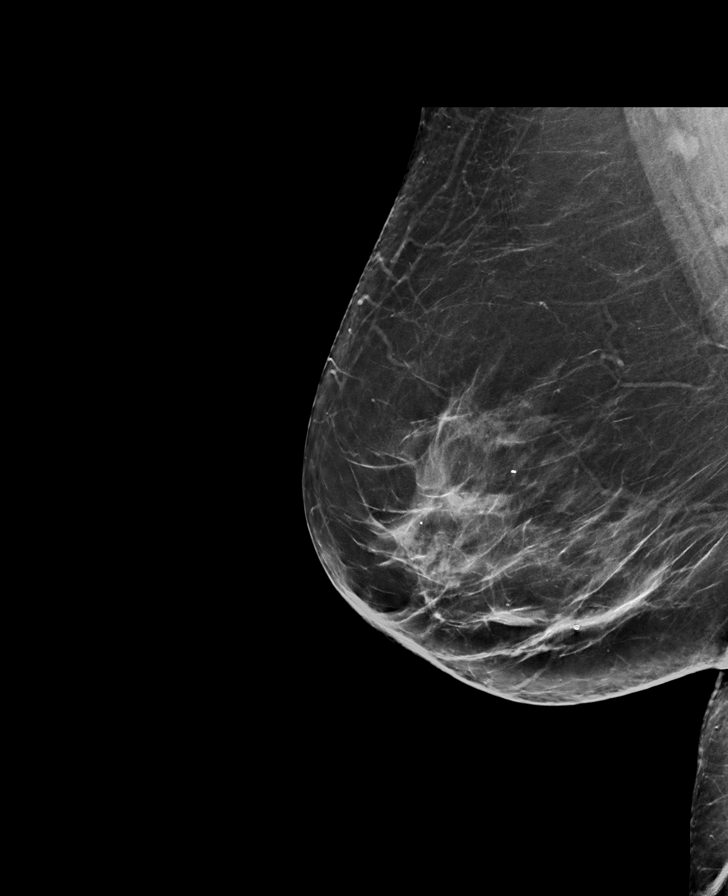

[R MLO tomo · tomo slice 40/79.0]
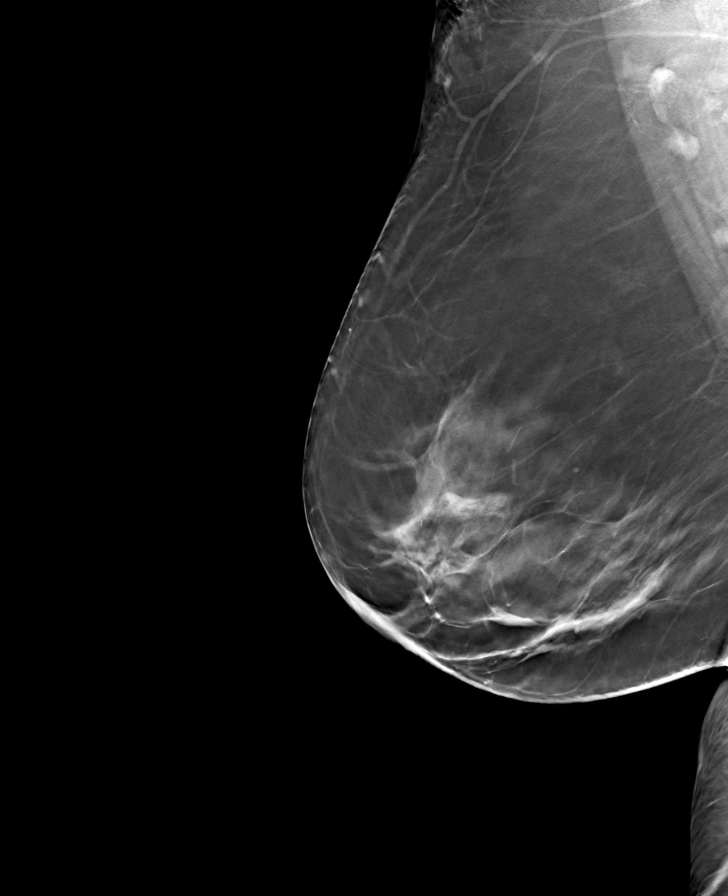

[L MLO tomo · tomo slice 41/82.0]
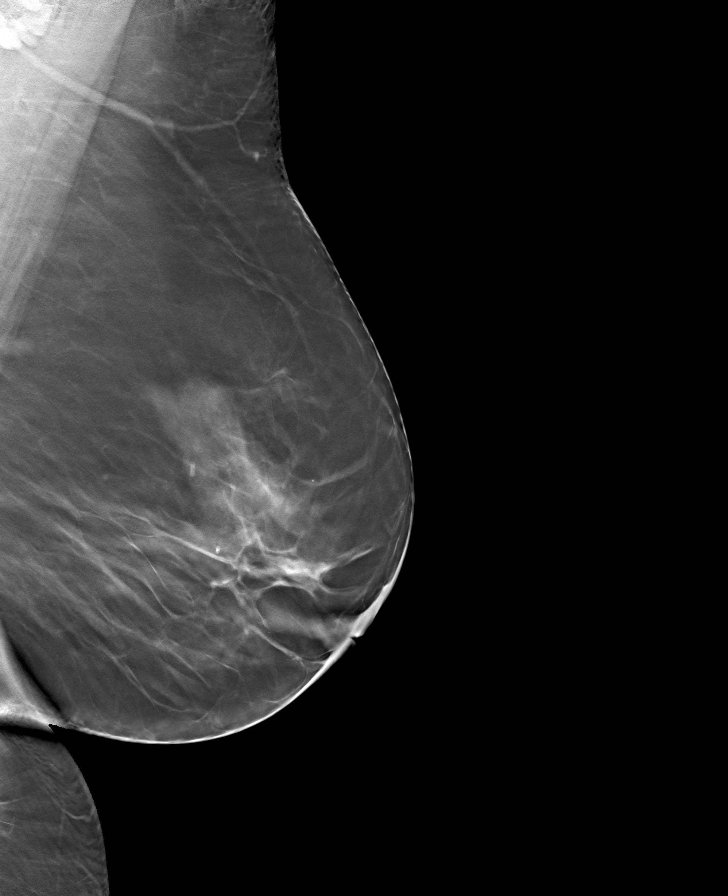

[L CC tomo · tomo slice 33/65.0]
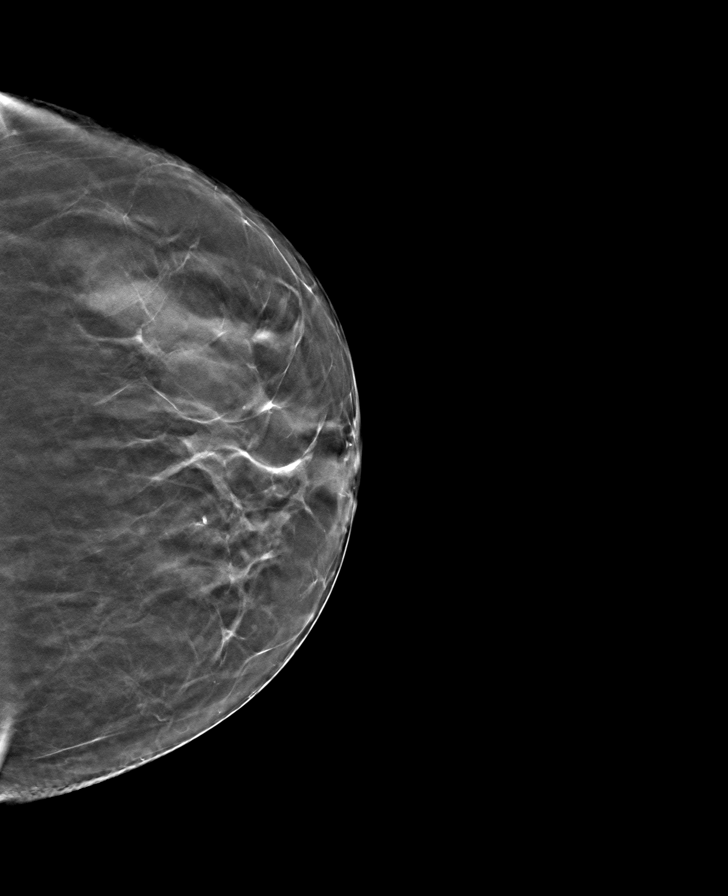

[R CC tomo · tomo slice 35/68.0]
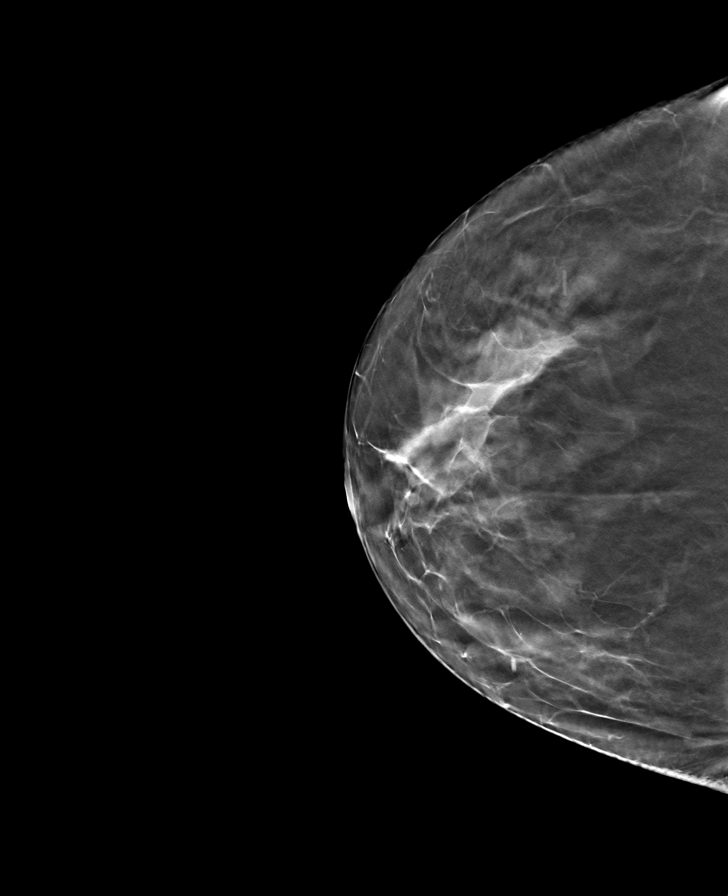

[8 of 24 positions shown; findings below may reference images not displayed]

ACR Breast Density Category c: The breast tissue is heterogeneously
dense, which may obscure small masses.
FINDINGS: No suspicious masses or calcifications are seen in either breast.
There is mild flattening/inversion of the left nipple. This appears
unchanged dating back to prior mammograms from 02/29/2016.

Mammographic images were processed with CAD.

Physical examination reveals inversion of the left nipple only. The
left nipple areola complex is otherwise normal in appearance. There
are no underlying palpable masses.

Targeted ultrasound of the retroareolar left breast was performed.
No suspicious masses or abnormality seen, only normal-appearing
fibroglandular tissue and ducts identified.
IMPRESSION: 1. No mammographic or sonographic abnormalities identified to
account for left nipple inversion. This appears to be a chronic
process and is most likely related to periductal fibrosis.

2.  No mammographic evidence of malignancy in either breast.

RECOMMENDATION:
1. Clinical follow-up for patient's left nipple inversion. This
appears to be chronic and most likely related to periductal
fibrosis, however if the patient experiences fixed nipple inversion
with an underlying palpable abnormality then recommend further
evaluation with breast MRI.

2.  Screening mammogram in one year.(Code:EK-9-AOE)

I have discussed the findings and recommendations with the patient.
If applicable, a reminder letter will be sent to the patient
regarding the next appointment.

BI-RADS CATEGORY  1: Negative.

## 2022-07-17 IMAGING — CT CT CHEST LUNG CANCER SCREENING LOW DOSE W/O CM
2 of 3 series · 15 of 36 positions shown, 18 images · non-contrast
Comparison: None.

CLINICAL DATA: Current smoker, 46 pack-year history.

EXAM:
CT CHEST WITHOUT CONTRAST LOW-DOSE FOR LUNG CANCER SCREENING
TECHNIQUE: Multidetector CT imaging of the chest was performed following the
standard protocol without IV contrast.

[Series 2: axial st · axial · 0.71mm/px · z∈[+41,+311]mm · 12 of 64 slices shown, 15 images]
[im 5/64  mediastinal]
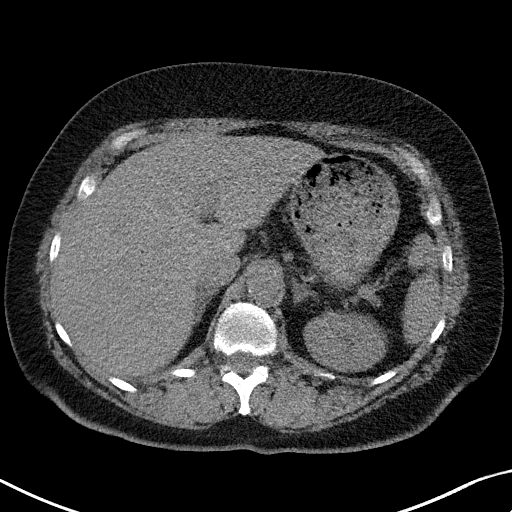
[im 5/64  lung]
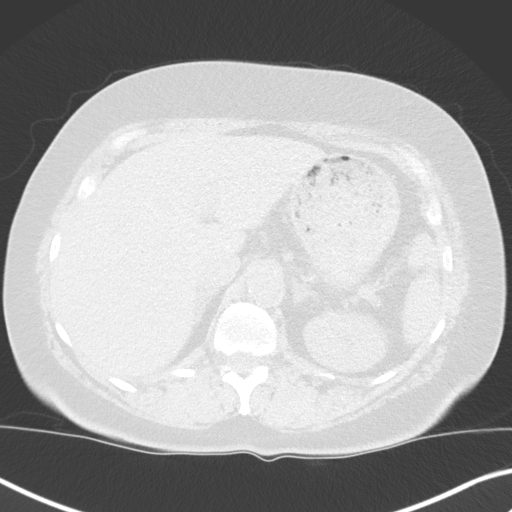
[im 10/64  lung]
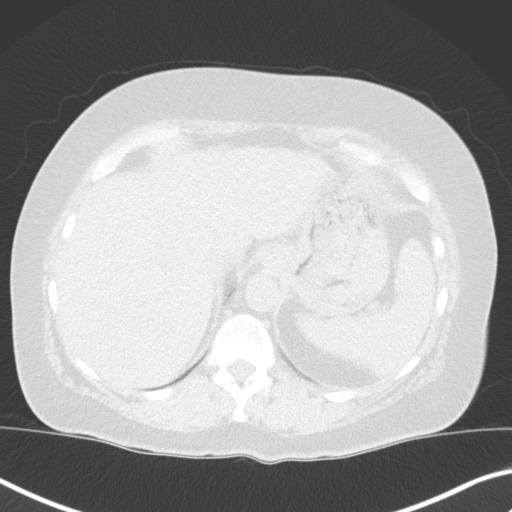
[im 15/64  lung]
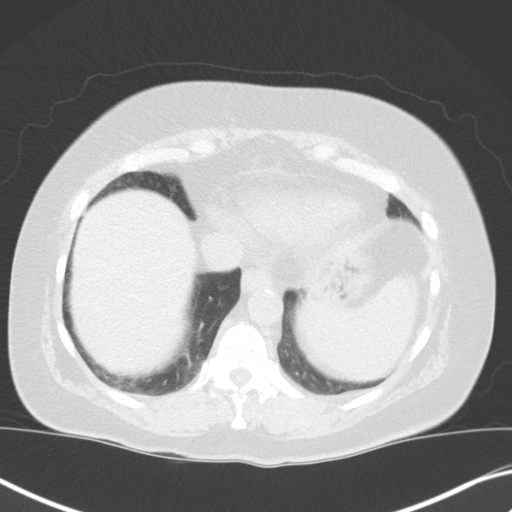
[im 19/64  lung]
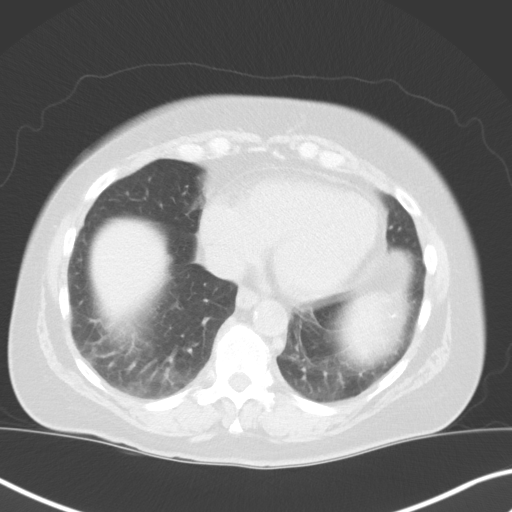
[im 24/64  mediastinal]
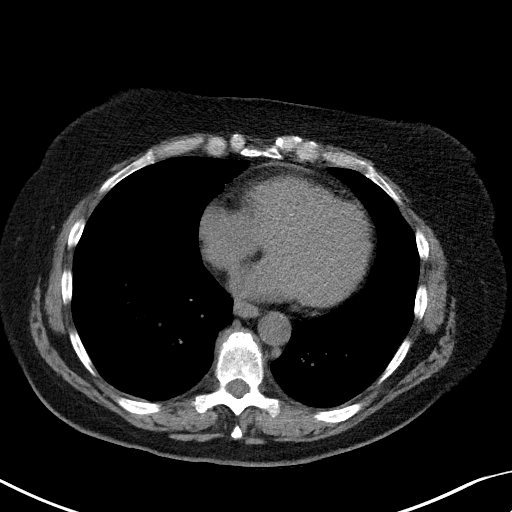
[im 24/64  lung]
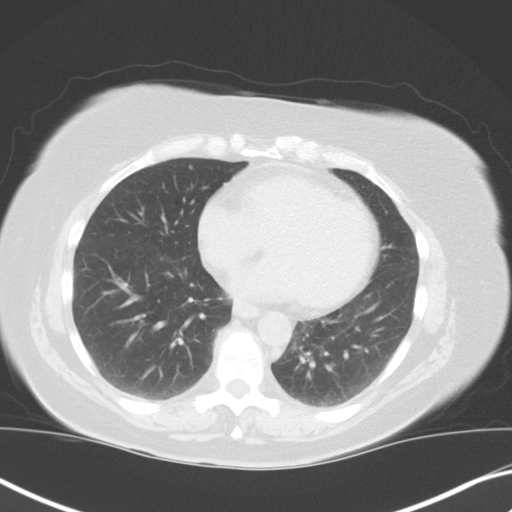
[im 29/64  lung]
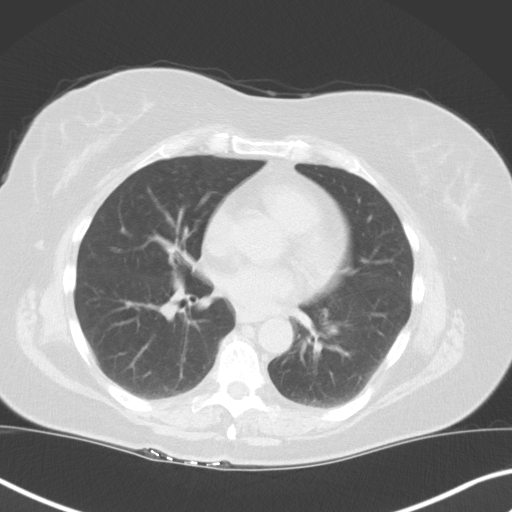
[im 36/64  lung]
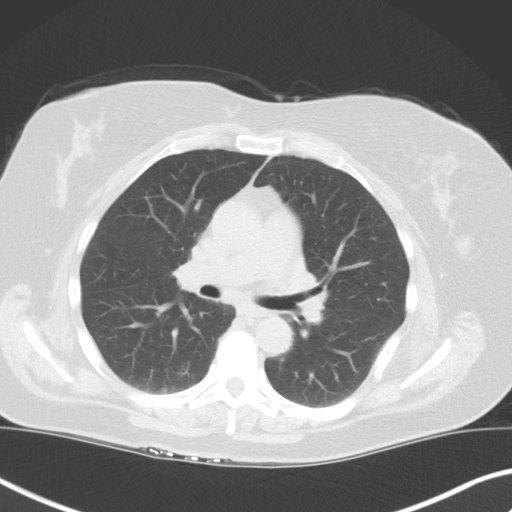
[im 40/64  lung]
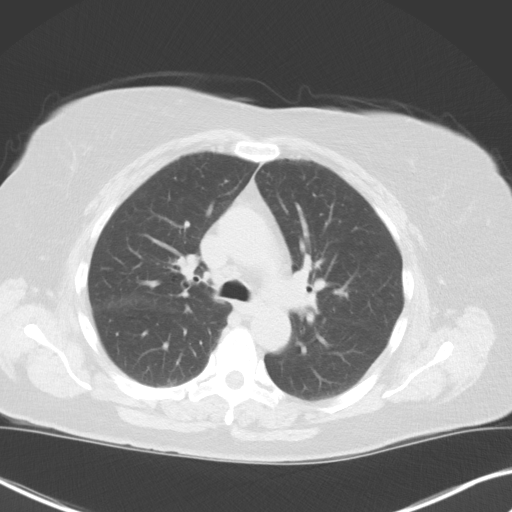
[im 45/64  mediastinal]
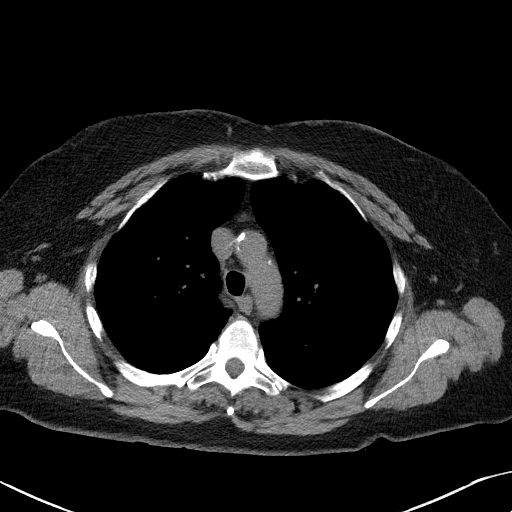
[im 45/64  lung]
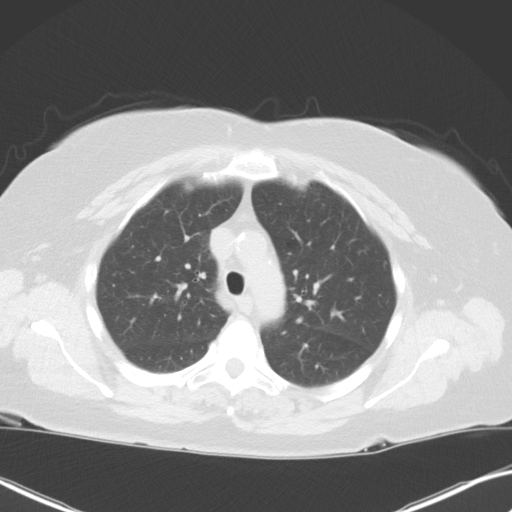
[im 50/64  lung]
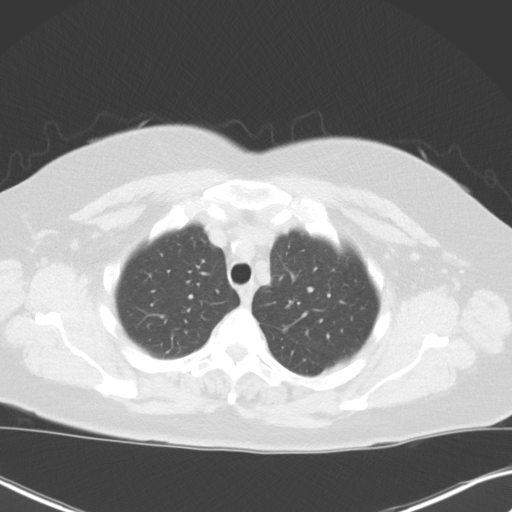
[im 54/64  lung]
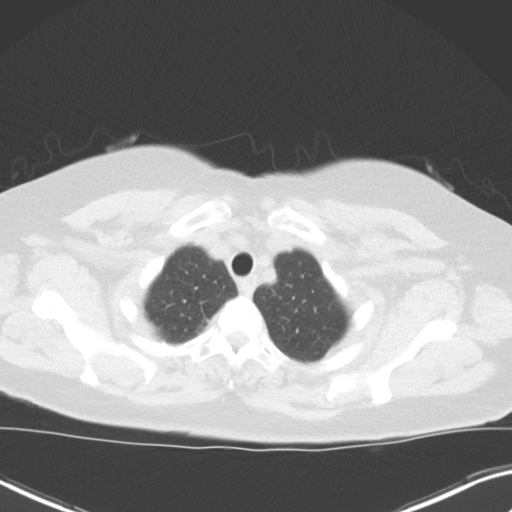
[im 59/64  lung]
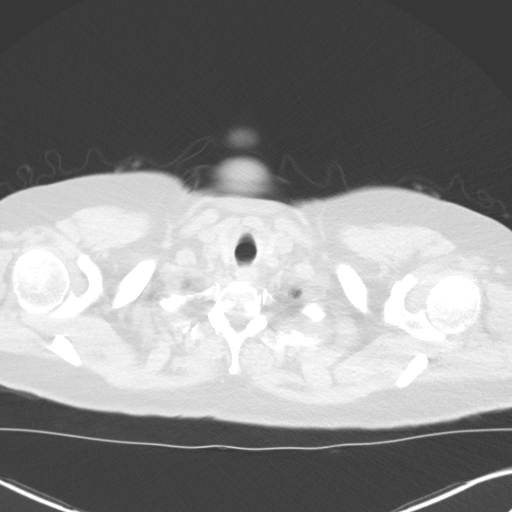

[Series 5: coronal · coronal · 0.64mm/px · 3 of 296 slices shown]
[im 60/296  lung]
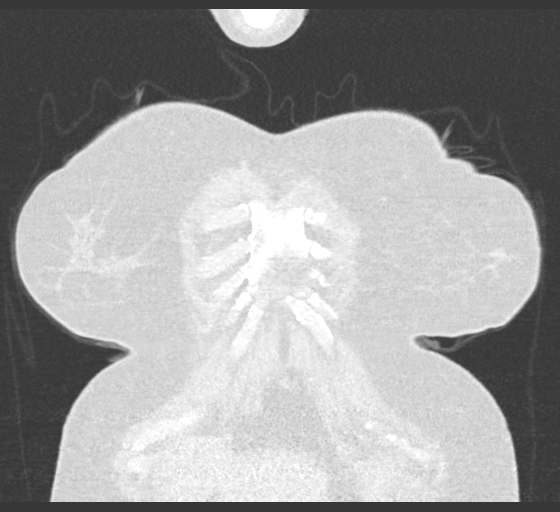
[im 119/296  lung]
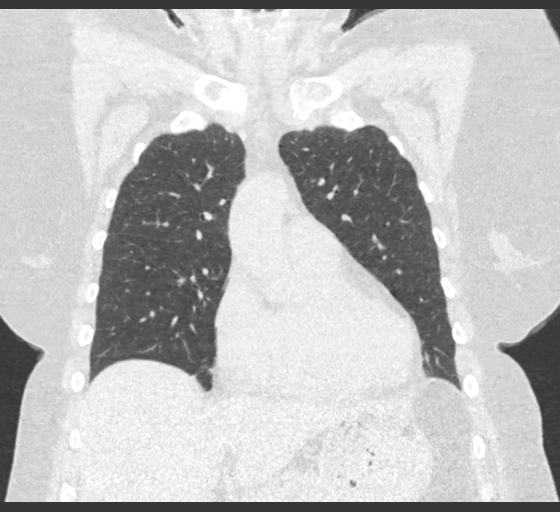
[im 178/296  lung]
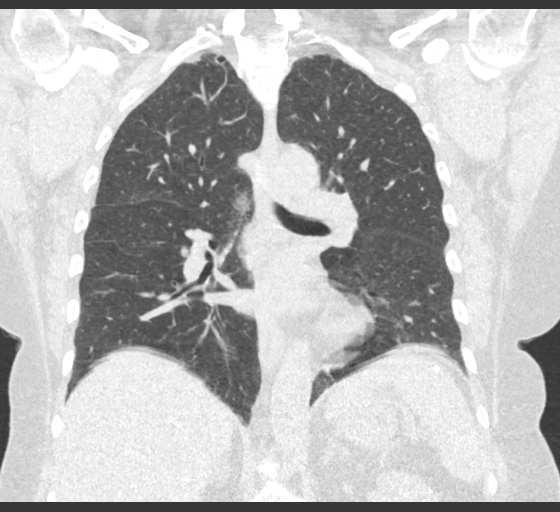

[15 of 36 positions shown; findings below may reference images not displayed]

FINDINGS: Cardiovascular: Atherosclerotic calcification of the aorta and
coronary arteries. Heart is at the upper limits of normal in size.
No pericardial effusion.

Mediastinum/Nodes: No pathologically enlarged mediastinal or
axillary lymph nodes. Hilar regions are difficult to definitively
evaluate without IV contrast. Esophagus is grossly unremarkable.

Lungs/Pleura: Centrilobular and paraseptal emphysema. Smoking
related respiratory bronchiolitis. Image quality in the lung bases
is degraded by respiratory motion. No suspicious pulmonary nodules.
No pleural fluid. Airway is unremarkable.

Upper Abdomen: Visualized portion of the liver is unremarkable.
Slight thickening of the adrenal glands. Visualized portions of the
left kidney, spleen, pancreas, stomach and bowel are grossly
unremarkable.

Musculoskeletal: Degenerative changes in the spine. Old rib
fractures.
IMPRESSION: 1. Lung-RADS 2, benign appearance or behavior. Continue annual
screening with low-dose chest CT without contrast in 12 months.
2. Aortic atherosclerosis (G0567-Z2D.D). Coronary artery
calcification.
3.  Emphysema (G0567-DPX.Z).

## 2022-12-04 ENCOUNTER — Ambulatory Visit: Payer: 59 | Admitting: Podiatry

## 2023-07-29 ENCOUNTER — Encounter: Payer: Self-pay | Admitting: Podiatry

## 2023-07-29 ENCOUNTER — Ambulatory Visit (INDEPENDENT_AMBULATORY_CARE_PROVIDER_SITE_OTHER): Admitting: Podiatry

## 2023-07-29 VITALS — Ht 68.0 in | Wt 183.0 lb

## 2023-07-29 DIAGNOSIS — M205X9 Other deformities of toe(s) (acquired), unspecified foot: Secondary | ICD-10-CM | POA: Diagnosis not present

## 2023-07-29 NOTE — Progress Notes (Signed)
 Subjective:  Patient ID: [name], female    DOB: 1960/05/25,  MRN: [id number]  Chief Complaint  Patient presents with   Callouses    Pt is here due to callous on the bottom of both feet, states they are all over the bottom of her feet, very painful to walk on, has been there for quite a while, has had them shaved before and they comes back.    63 y.o. female presents with the above complaint.  Patient presents with bilateral hallux pinch callus.  Patient states that it been present for quite some time is progressive gotten worse worse with ambulation is with pressure 1-2 have been debrided down she states that she has had them shaved before but they keep coming back.  Denies any other acute issues.   Review of Systems: Negative except as noted in the HPI. Denies N/V/F/Ch.  Past Medical History:  Diagnosis Date   GERD (gastroesophageal reflux disease)    OSA (obstructive sleep apnea)     Current Outpatient Medications:    amitriptyline (ELAVIL) 25 MG tablet, Take 25 mg by mouth at bedtime., Disp: , Rfl:    loratadine (CLARITIN) 10 MG tablet, Take 10 mg by mouth daily., Disp: , Rfl:    Omeprazole 20 MG TBEC, Take 1 tablet by mouth daily. , Disp: , Rfl:    temazepam (RESTORIL) 15 MG capsule, Take 15 mg by mouth at bedtime as needed for sleep., Disp: , Rfl:    varenicline (CHANTIX PAK) 0.5 MG X 11 & 1 MG X 42 tablet, Take by mouth 2 (two) times daily. Take one 0.5 mg tablet by mouth once daily for 3 days, then increase to one 0.5 mg tablet twice daily for 4 days, then increase to one 1 mg tablet twice daily., Disp: , Rfl:    albuterol  (PROVENTIL  HFA;VENTOLIN  HFA) 108 (90 Base) MCG/ACT inhaler, Inhale 1-2 puffs into the lungs every 6 (six) hours as needed for wheezing or shortness of breath. (Patient not taking: Reported on 07/29/2023), Disp: 1 Inhaler, Rfl: 0   ibuprofen  (ADVIL ,MOTRIN ) 800 MG tablet, Take 1 tablet (800 mg total) by mouth 3 (three) times daily. (Patient not taking:  Reported on 07/29/2023), Disp: 21 tablet, Rfl: 0   methocarbamol  (ROBAXIN ) 500 MG tablet, Take 1 tablet (500 mg total) by mouth 2 (two) times daily. (Patient not taking: Reported on 07/29/2023), Disp: 20 tablet, Rfl: 0  Social History   Tobacco Use  Smoking Status Every Day   Current packs/day: 0.50   Types: Cigarettes  Smokeless Tobacco Never    Allergies  Allergen Reactions   Doxycycline    Objective:  There were no vitals filed for this visit. Body mass index is 27.83 kg/m. Constitutional Well developed. Well nourished.  Vascular Dorsalis pedis pulses palpable bilaterally. Posterior tibial pulses palpable bilaterally. Capillary refill normal to all digits.  No cyanosis or clubbing noted. Pedal hair growth normal.  Neurologic Normal speech. Oriented to person, place, and time. Epicritic sensation to light touch grossly present bilaterally.  Dermatologic Bilateral hallux IPJ callus with hyperkeratotic lesion.  No open wounds or lesion noted.  No pinpoint bleeding noted upon debridement  Orthopedic: Normal joint ROM without pain or crepitus bilaterally. No visible deformities. No bony tenderness.   Radiographs: None Assessment:   1. Hallux limitus, unspecified laterality    Plan:  Patient was evaluated and treated and all questions answered.  Bilateral hallux IPJ callus with underlying hallux limitus - All questions and concerns were discussed with  the patient in extensive detail given the presence of callus I discussed with the patient debridement is helpful.  As a courtesy the lesions were debrided down to healthy scar tissue no complication noted no pinpoint bleeding noted - I discussed shoe gear modification.  No follow-ups on file.
